# Patient Record
Sex: Female | Born: 1955 | Race: White | Hispanic: No | Marital: Married | Smoking: Never smoker
Health system: Southern US, Community
[De-identification: ages and names within clinical notes are randomized; demographics above are authoritative.]

## PROBLEM LIST (undated history)

## (undated) DIAGNOSIS — R5383 Other fatigue: Secondary | ICD-10-CM

## (undated) DIAGNOSIS — R9431 Abnormal electrocardiogram [ECG] [EKG]: Secondary | ICD-10-CM

## (undated) DIAGNOSIS — J9809 Other diseases of bronchus, not elsewhere classified: Secondary | ICD-10-CM

## (undated) DIAGNOSIS — R7303 Prediabetes: Secondary | ICD-10-CM

## (undated) DIAGNOSIS — F419 Anxiety disorder, unspecified: Secondary | ICD-10-CM

## (undated) DIAGNOSIS — N289 Disorder of kidney and ureter, unspecified: Secondary | ICD-10-CM

## (undated) DIAGNOSIS — I10 Essential (primary) hypertension: Secondary | ICD-10-CM

## (undated) DIAGNOSIS — R6884 Jaw pain: Secondary | ICD-10-CM

## (undated) DIAGNOSIS — R6889 Other general symptoms and signs: Secondary | ICD-10-CM

## (undated) DIAGNOSIS — N200 Calculus of kidney: Secondary | ICD-10-CM

## (undated) HISTORY — DX: Other fatigue: R53.83

## (undated) HISTORY — DX: Other diseases of bronchus, not elsewhere classified: J98.09

## (undated) HISTORY — PX: BRAIN SURGERY: SHX531

## (undated) HISTORY — DX: Other general symptoms and signs: R68.89

## (undated) HISTORY — PX: APPENDECTOMY: SHX54

## (undated) HISTORY — DX: Essential (primary) hypertension: I10

## (undated) HISTORY — DX: Abnormal electrocardiogram (ECG) (EKG): R94.31

## (undated) HISTORY — DX: Calculus of kidney: N20.0

## (undated) HISTORY — PX: OTHER SURGICAL HISTORY: SHX169

## (undated) HISTORY — DX: Prediabetes: R73.03

## (undated) HISTORY — DX: Jaw pain: R68.84

## (undated) HISTORY — DX: Anxiety disorder, unspecified: F41.9

---

## 2001-06-14 ENCOUNTER — Other Ambulatory Visit: Admission: RE | Admit: 2001-06-14 | Discharge: 2001-06-14 | Payer: Self-pay | Admitting: Obstetrics and Gynecology

## 2002-07-16 ENCOUNTER — Ambulatory Visit (HOSPITAL_COMMUNITY): Admission: RE | Admit: 2002-07-16 | Discharge: 2002-07-16 | Payer: Self-pay | Admitting: Obstetrics and Gynecology

## 2002-07-16 ENCOUNTER — Encounter: Payer: Self-pay | Admitting: Obstetrics and Gynecology

## 2003-07-02 ENCOUNTER — Other Ambulatory Visit: Admission: RE | Admit: 2003-07-02 | Discharge: 2003-07-02 | Payer: Self-pay | Admitting: Obstetrics and Gynecology

## 2003-07-16 ENCOUNTER — Encounter: Payer: Self-pay | Admitting: Obstetrics and Gynecology

## 2003-07-16 ENCOUNTER — Ambulatory Visit (HOSPITAL_COMMUNITY): Admission: RE | Admit: 2003-07-16 | Discharge: 2003-07-16 | Payer: Self-pay | Admitting: Obstetrics and Gynecology

## 2004-06-22 ENCOUNTER — Ambulatory Visit (HOSPITAL_COMMUNITY): Admission: RE | Admit: 2004-06-22 | Discharge: 2004-06-22 | Payer: Self-pay | Admitting: Obstetrics and Gynecology

## 2009-01-29 ENCOUNTER — Ambulatory Visit (HOSPITAL_COMMUNITY): Admission: RE | Admit: 2009-01-29 | Discharge: 2009-01-29 | Payer: Self-pay | Admitting: Pediatrics

## 2009-04-04 ENCOUNTER — Encounter: Payer: Self-pay | Admitting: Cardiology

## 2011-03-31 ENCOUNTER — Encounter: Payer: Self-pay | Admitting: Cardiology

## 2011-04-13 DIAGNOSIS — R6884 Jaw pain: Secondary | ICD-10-CM | POA: Insufficient documentation

## 2011-04-13 DIAGNOSIS — R9431 Abnormal electrocardiogram [ECG] [EKG]: Secondary | ICD-10-CM | POA: Insufficient documentation

## 2011-04-13 DIAGNOSIS — R5383 Other fatigue: Secondary | ICD-10-CM | POA: Insufficient documentation

## 2011-04-13 DIAGNOSIS — G43909 Migraine, unspecified, not intractable, without status migrainosus: Secondary | ICD-10-CM | POA: Insufficient documentation

## 2011-04-13 DIAGNOSIS — F419 Anxiety disorder, unspecified: Secondary | ICD-10-CM | POA: Insufficient documentation

## 2011-04-13 DIAGNOSIS — R6889 Other general symptoms and signs: Secondary | ICD-10-CM | POA: Insufficient documentation

## 2011-04-14 ENCOUNTER — Encounter: Payer: Self-pay | Admitting: Cardiology

## 2011-04-14 ENCOUNTER — Ambulatory Visit (INDEPENDENT_AMBULATORY_CARE_PROVIDER_SITE_OTHER): Payer: BC Managed Care – PPO | Admitting: Cardiology

## 2011-04-14 VITALS — BP 126/76 | HR 85 | Ht 61.0 in | Wt 198.0 lb

## 2011-04-14 DIAGNOSIS — R9431 Abnormal electrocardiogram [ECG] [EKG]: Secondary | ICD-10-CM

## 2011-04-14 NOTE — Patient Instructions (Signed)
Your physician recommends that you weigh, daily, at the same time every day, and in the same amount of clothing. Please record your daily weights on the handout provided and bring it to your next appointment.  Your physician recommends that you start walking at least 3 hours per week  Your physician recommends that you schedule a follow-up appointment in: as needed

## 2011-04-14 NOTE — Progress Notes (Signed)
HPI [name] is a delightful 55 year old married white female who comes today referred by Dr. Milford Cage for the evaluation and management of an abnormal EKG.  This was found on a routine physical. Outside tracing shows normal sinus rhythm with a left axis shift with a left anterior fascicular block. She has poor progression in the anterior precordium but no ST segment changes. She is totally asymptomatic except for some mild dyspnea on exertion which is chronic. She attributes this 2 weight.  She's had no previous cardiac history. She denies any orthopnea, PND or edema. She's had no palpitations or syncope nor any chest pain.  Blood work from 2010 showed her blood sugar was elevated at 115. Lipid panel at that time was also abnormal with an HDL 45, total cholesterol 782, triglycerides of 272 VLDL of 54 and LDL 119. Thyroid function that time was normal.  She does not exercise. She's a fabulous cup. She enjoys natural fruit and some sweets and also complex carbs.  Her echocardiogram was repeated here shows normal sinus rhythm with a left anterior fascicular block and poor R-Wave progression.Marland Kitchen No changes From outside EKG.    Past Medical History  Diagnosis Date  . Migraine   . Anxiety   . Fatigue   . Jaw pain   . Abnormal EKG   . Heat intolerance     No past surgical history on file.  No family history on file.  History   Social History  . Marital Status: Married    Spouse Name: N/A    Number of Children: N/A  . Years of Education: N/A   Occupational History  . Not on file.   Social History Main Topics  . Smoking status: Never Smoker   . Smokeless tobacco: Never Used  . Alcohol Use: No  . Drug Use: No  . Sexually Active: Not on file   Other Topics Concern  . Not on file   Social History Narrative  . No narrative on file    Allergies  Allergen Reactions  . Phenobarbital     Current Outpatient Prescriptions  Medication Sig Dispense Refill  . ALPRAZolam (XANAX)  0.5 MG tablet Take 0.5 mg by mouth at bedtime as needed.        Marland Kitchen aspirin 81 MG tablet Take 81 mg by mouth daily.        Marland Kitchen FLUoxetine (PROZAC) 20 MG capsule Take 20 mg by mouth 3 (three) times daily.        Marland Kitchen losartan-hydrochlorothiazide (HYZAAR) 100-25 MG per tablet Take 1 tablet by mouth daily.        Marland Kitchen DISCONTD: FLUoxetine HCl (PROZAC PO) Take 1 tablet by mouth daily.       Marland Kitchen DISCONTD: oxyCODONE-acetaminophen (PERCOCET) 5-325 MG per tablet Take 1 tablet by mouth every 4 (four) hours as needed.          ROS Negative other than HPI.   PE General Appearance: well developed, well nourished in no acute distress, obese HEENT: symmetrical face, PERRLA, good dentition  Neck: no JVD, thyromegaly, or adenopathy, trachea midline Chest: symmetric without deformity Cardiac: PMI non-displaced, RRR, normal S1, S2, no gallop or murmur Lung: clear to ausculation and percussion Vascular: all pulses full without bruits  Abdominal: nondistended, nontender, good bowel sounds, no HSM, no bruits Extremities: no cyanosis, clubbing or edema, no sign of DVT, no varicosities  Skin: normal color, no rashes Neuro: alert and oriented x 3, non-focal Pysch: normal affect  Filed Vitals:   04/14/11  1031  BP: 126/76  Pulse: 85  Height: 5\' 1"  (1.549 m)  Weight: 198 lb (89.812 kg)  SpO2: 97%    EKG  Labs and Studies Reviewed.   No results found for this basename: WBC, HGB, HCT, MCV, PLT      Chemistry   No results found for this basename: NA, K, CL, CO2, BUN, CREATININE, GLU   No results found for this basename: CALCIUM, ALKPHOS, AST, ALT, BILITOT       No results found for this basename: CHOL   No results found for this basename: HDL   No results found for this basename: LDLCALC   No results found for this basename: TRIG   No results found for this basename: CHOLHDL   No results found for this basename: HGBA1C   No results found for this basename: ALT, AST, GGT, ALKPHOS, BILITOT   No  results found for this basename: TSH

## 2011-04-14 NOTE — Assessment & Plan Note (Signed)
Her EKG is normal for her except for a degree of left axis deviation. My primary concern for her is her risk factors for coronary disease and other vascular disease including her weight, likelihood she may be prediabetic or diabetic, and her mixed hyperlipidemia. I've advised Kela Baccari 3 hours a week, loose at least 20-25 pounds, and have her fasting blood work checked per Dr. Milford Cage. I will leave it to his expertise for treating any metabolic abnormalities. If she is diabetic her LDL needs to be below 100. She will most likely need a statin. If she is diabetic as well she will need a low dose aspirin 81 mg a day. We will see her back on a p.r.n. Basis.

## 2013-04-16 ENCOUNTER — Emergency Department (HOSPITAL_COMMUNITY): Payer: BC Managed Care – PPO

## 2013-04-16 ENCOUNTER — Emergency Department (HOSPITAL_COMMUNITY)
Admission: EM | Admit: 2013-04-16 | Discharge: 2013-04-16 | Disposition: A | Discharge status: Home / Self Care | Payer: BC Managed Care – PPO | Attending: Emergency Medicine | Admitting: Emergency Medicine

## 2013-04-16 ENCOUNTER — Encounter (HOSPITAL_COMMUNITY): Payer: Self-pay

## 2013-04-16 DIAGNOSIS — M5030 Other cervical disc degeneration, unspecified cervical region: Secondary | ICD-10-CM

## 2013-04-16 DIAGNOSIS — Z9889 Other specified postprocedural states: Secondary | ICD-10-CM | POA: Insufficient documentation

## 2013-04-16 DIAGNOSIS — Z87448 Personal history of other diseases of urinary system: Secondary | ICD-10-CM | POA: Insufficient documentation

## 2013-04-16 DIAGNOSIS — F411 Generalized anxiety disorder: Secondary | ICD-10-CM | POA: Insufficient documentation

## 2013-04-16 DIAGNOSIS — Z8679 Personal history of other diseases of the circulatory system: Secondary | ICD-10-CM | POA: Insufficient documentation

## 2013-04-16 DIAGNOSIS — Z79899 Other long term (current) drug therapy: Secondary | ICD-10-CM | POA: Insufficient documentation

## 2013-04-16 DIAGNOSIS — R209 Unspecified disturbances of skin sensation: Secondary | ICD-10-CM | POA: Insufficient documentation

## 2013-04-16 DIAGNOSIS — Z7982 Long term (current) use of aspirin: Secondary | ICD-10-CM | POA: Insufficient documentation

## 2013-04-16 DIAGNOSIS — R51 Headache: Secondary | ICD-10-CM | POA: Insufficient documentation

## 2013-04-16 DIAGNOSIS — Z8719 Personal history of other diseases of the digestive system: Secondary | ICD-10-CM | POA: Insufficient documentation

## 2013-04-16 DIAGNOSIS — R450 Nervousness: Secondary | ICD-10-CM | POA: Insufficient documentation

## 2013-04-16 DIAGNOSIS — M4802 Spinal stenosis, cervical region: Secondary | ICD-10-CM

## 2013-04-16 HISTORY — DX: Disorder of kidney and ureter, unspecified: N28.9

## 2013-04-16 MED ORDER — DIAZEPAM 5 MG PO TABS
5.0000 mg | ORAL_TABLET | Freq: Once | ORAL | Status: AC
  Administered 2013-04-16: 5 mg via ORAL
  Filled 2013-04-16: qty 1

## 2013-04-16 MED ORDER — HYDROMORPHONE HCL PF 1 MG/ML IJ SOLN
1.0000 mg | Freq: Once | INTRAMUSCULAR | Status: AC
  Administered 2013-04-16: 1 mg via INTRAVENOUS
  Filled 2013-04-16: qty 1

## 2013-04-16 MED ORDER — DEXAMETHASONE 6 MG PO TABS: ORAL_TABLET | ORAL | Status: DC

## 2013-04-16 MED ORDER — METHOCARBAMOL 500 MG PO TABS: 500.0000 mg | ORAL_TABLET | Freq: Three times a day (TID) | ORAL | Status: DC

## 2013-04-16 MED ORDER — DEXAMETHASONE SODIUM PHOSPHATE 4 MG/ML IJ SOLN
10.0000 mg | Freq: Once | INTRAMUSCULAR | Status: AC
  Administered 2013-04-16: 10 mg via INTRAVENOUS
  Filled 2013-04-16: qty 3

## 2013-04-16 MED ORDER — ONDANSETRON HCL 4 MG PO TABS
4.0000 mg | ORAL_TABLET | Freq: Once | ORAL | Status: AC
  Administered 2013-04-16: 4 mg via ORAL
  Filled 2013-04-16: qty 1

## 2013-04-16 MED ORDER — OXYCODONE-ACETAMINOPHEN 5-325 MG PO TABS: 1.0000 | ORAL_TABLET | Freq: Four times a day (QID) | ORAL | Status: DC | PRN

## 2013-04-16 NOTE — ED Provider Notes (Signed)
History     CSN: [id number]  Arrival date & time 04/16/13  0745   First MD Initiated Contact with Patient 04/16/13 [phone]      Chief Complaint  Patient presents with  . Neck Pain    (Consider location/radiation/quality/duration/timing/severity/associated sxs/prior treatment) Patient is a 57 y.o. female presenting with neck pain. The history is provided by the patient.  Neck Pain Pain location:  L side Quality:  Aching, burning and cramping Pain radiates to:  L scapula and L arm Pain severity:  Severe Pain is:  Same all the time Onset quality:  Gradual Duration:  1 week Timing:  Constant Progression:  Worsening Chronicity:  New Context: not fall, not lifting a heavy object, not MCA and not recent injury   Relieved by:  Nothing Ineffective treatments:  NSAIDs (TENs Unit) Associated symptoms: headaches and tingling   Associated symptoms: no bladder incontinence, no bowel incontinence, no chest pain and no numbness   Risk factors: no recent head injury and no recurrent falls     Past Medical History  Diagnosis Date  . Migraine   . Anxiety   . Fatigue   . Jaw pain   . Abnormal EKG   . Heat intolerance   . Renal disorder     Past Surgical History  Procedure Laterality Date  . Cesarean section    . Brain surgery    . Laproscopy    . Kidnsey stones       No family history on file.  History  Substance Use Topics  . Smoking status: Never Smoker   . Smokeless tobacco: Never Used  . Alcohol Use: No    OB History   Grav Para Term Preterm Abortions TAB SAB Ect Mult Living                  Review of Systems  HENT: Positive for neck pain.   Cardiovascular: Negative for chest pain.  Gastrointestinal: Negative for bowel incontinence.  Genitourinary: Negative for bladder incontinence.  Neurological: Positive for tingling and headaches. Negative for numbness.  Psychiatric/Behavioral: The patient is nervous/anxious.     Allergies  Phenobarbital  Home  Medications   Current Outpatient Rx  Name  Route  Sig  Dispense  Refill  . ALPRAZolam (XANAX) 0.5 MG tablet   Oral   Take 0.5 mg by mouth at bedtime as needed for sleep.          Marland Kitchen aspirin 81 MG tablet   Oral   Take 81 mg by mouth daily.           Marland Kitchen FLUoxetine (PROZAC) 20 MG capsule   Oral   Take 20 mg by mouth daily.          Marland Kitchen losartan-hydrochlorothiazide (HYZAAR) 100-25 MG per tablet   Oral   Take 1 tablet by mouth daily.           . Multiple Vitamin (MULTIVITAMIN WITH MINERALS) TABS   Oral   Take 1 tablet by mouth daily.           BP 154/96  Pulse 99  Temp(Src) 98.6 F (37 C) (Oral)  Resp 22  Ht 5\' 2"  (1.575 m)  Wt 198 lb (89.812 kg)  BMI 36.21 kg/m2  SpO2 99%  Physical Exam  Nursing note and vitals reviewed. Constitutional: She is oriented to person, place, and time. She appears well-developed and well-nourished.  Non-toxic appearance.  HENT:  Head: Normocephalic.  Right Ear: Tympanic membrane and external ear normal.  Left Ear: Tympanic membrane and external ear normal.  Eyes: EOM and lids are normal. Pupils are equal, round, and reactive to light.  Neck: Neck supple. Carotid bruit is not present.  Pain with attempted ROM of the neck. Left more than right. Pain extends to the C5-C6 area. Some pain to palpation. No hot areas  Cardiovascular: Normal rate, regular rhythm, normal heart sounds, intact distal pulses and normal pulses.   Pulmonary/Chest: Breath sounds normal. No respiratory distress.  Abdominal: Soft. Bowel sounds are normal. There is no tenderness. There is no guarding.  Musculoskeletal:       Left shoulder: She exhibits decreased range of motion.       Arms: Lymphadenopathy:       Head (right side): No submandibular adenopathy present.       Head (left side): No submandibular adenopathy present.    She has no cervical adenopathy.  Neurological: She is alert and oriented to person, place, and time. She displays no atrophy and no  tremor. No cranial nerve deficit or sensory deficit.  Pt hesitant to cooperate with the strength exam of the left upper ext due to pain.  Skin: Skin is warm and dry.  Psychiatric: She has a normal mood and affect. Her speech is normal.    ED Course  Procedures (including critical care time)  Labs Reviewed - No data to display Ct Cervical Spine Wo Contrast  04/16/2013   *RADIOLOGY REPORT*  Clinical Data: Left-sided neck and shoulder pain for 1 week.  No trauma.  Recent chiropractic visit.  CT CERVICAL SPINE WITHOUT CONTRAST  Technique:  Multidetector CT imaging of the cervical spine was performed. Multiplanar CT image reconstructions were also generated.  Comparison: None.  Findings: Spinal visualization through the bottom of C7.  Mild obscuration due to overlying soft tissues and C5 inferiorly. Prevertebral soft tissues are within normal limits.  Relatively mild spondylosis at C4-C5 with disc osteophyte complex and mild left-sided neural foraminal narrowing secondary uncovertebral joint hypertrophy.  Moderate spondylosis at C5-C6, with prominent disc osteophyte complex which causes probable central canal stenosis.  Aerated petrous apices. Skull base intact.  Maintenance of vertebral body heights.  Mild straightening and reversal expected lordosis centered about C5-C6. Facets are well-aligned.  Coronal reformats demonstrate a normal C1-C2 articulation.  .  IMPRESSION:  1. No acute osseous abnormality. 2.  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 This results in probable central canal and left-sided neural foraminal narrowing. Suboptimally evaluated with CT.  MRI would be of increased accuracy. 3.  Mild nonspecific straightening and reversal of expected lordosis centered about the C5-C6 level.   Original Report Authenticated By: Jeronimo Greaves, M.D.    Date: 04/16/2013  Rat80  Rhythm: normal sinus rhythm  QRS Axis: normal  Intervals: normal  ST/T Wave abnormalities: None  Conduction Disutrbances:none   Narrative Interpretation: No STEMI  Old EKG Reviewed: none available   No diagnosis found.    MDM  *I have reviewed nursing notes, vital signs, and all appropriate lab and imaging results for this patient.** Pain partially improved with IV dilaudid. A second dose given. CT of the c spine reveals C4-C5 spondylosis and left sided foraminal narrowing. MRI suggested. Due to increasing pain, some change in strength of the upper extremity and suggestion of MRI screening by radiology, MRI was obtained. Pt has left lateral recess and paracentral disc protrusion of C6-c7.  There is moderate central stenosis with moderate left foraminal stenosis. There is a small amount of blood product suspicious for subacute  disc protrusion.  Pt referred to neurosurgery. Rx for decadron, robaxin and percocet given to the patient. Pt to return to ED immediately if any changes or problem.       Kathie Dike, PA-C 04/23/13 2137

## 2013-04-16 NOTE — Progress Notes (Signed)
One gold necklace with small diamond pendant, 2 gold hoop earrings placed in ziploc bag and given to patient.

## 2013-04-16 NOTE — ED Notes (Signed)
Pt reports left side of neck and left arm pain for 1 week, has been to chiropractor and pain is worse, has tried otc meds .    Tearful, unable to put arm down.

## 2013-04-26 NOTE — ED Provider Notes (Signed)
Medical screening examination/treatment/procedure(s) were performed by non-physician practitioner and as supervising physician I was immediately available for consultation/collaboration.  Hurman Horn, MD 04/26/13 1248

## 2013-07-09 ENCOUNTER — Encounter: Payer: Self-pay | Admitting: Family Medicine

## 2013-07-11 ENCOUNTER — Ambulatory Visit (INDEPENDENT_AMBULATORY_CARE_PROVIDER_SITE_OTHER): Payer: BC Managed Care – PPO | Admitting: Family Medicine

## 2013-07-11 ENCOUNTER — Encounter: Payer: Self-pay | Admitting: Family Medicine

## 2013-07-11 VITALS — BP 160/78 | Ht 62.0 in | Wt 208.2 lb

## 2013-07-11 DIAGNOSIS — I10 Essential (primary) hypertension: Secondary | ICD-10-CM

## 2013-07-11 DIAGNOSIS — F411 Generalized anxiety disorder: Secondary | ICD-10-CM

## 2013-07-11 MED ORDER — LOSARTAN POTASSIUM-HCTZ 100-25 MG PO TABS: 1.0000 | ORAL_TABLET | Freq: Every day | ORAL | Status: AC

## 2013-07-11 MED ORDER — FLUOXETINE HCL 20 MG PO CAPS: 20.0000 mg | ORAL_CAPSULE | Freq: Every day | ORAL | Status: DC

## 2013-07-11 MED ORDER — ALPRAZOLAM 0.25 MG PO TABS: 0.2500 mg | ORAL_TABLET | Freq: Every evening | ORAL | Status: DC | PRN

## 2013-07-11 NOTE — Patient Instructions (Addendum)

## 2013-07-11 NOTE — Progress Notes (Signed)
  Subjective:    Patient ID: Sabrina Mcdonald, female    DOB: Jun 12, 1956, 57 y.o.   MRN: [id number]  HPI Pt here to re-establish care. She has not seen a pcp in over a year since previous pcp left. She ran out of her BP meds a week ago. In the past year she has been well except for an incident over the summer wherein she required surgery on her spine for cervical stenosis. She has 3 chronic problems to discuss today:  Anxiety - previous prescription was for xanax 0.5mg  and she always broken them in half. Says she took roughly 8-10 per month before bed as needed for anxiety. Does well on this dose.   Depression - has been on prozac 20mg /day for 2-3 years and finds it very helpful in regulating her mood. No adverse effects that she has noticed.   HTN - has been on hyzaar for over a year but not for the past week. She has not had any adverse effects. She says her BP even on it was "borderline" which to her meant upper 130's/80. She has not had any cp, headaches, or blurred vision this week.   She has not had a cpe or bloodwork done in over a year.    Review of Systemsno chest pain, no GI sx     Objective:   Physical Exam  Nursing note and vitals reviewed. Constitutional: She appears well-developed and well-nourished.  HENT:  Right Ear: External ear normal.  Left Ear: External ear normal.  Mouth/Throat: Oropharynx is clear and moist.  Neck: Normal range of motion. Neck supple. No thyromegaly present.  Cardiovascular: Normal rate, regular rhythm and normal heart sounds.   Pulmonary/Chest: Effort normal and breath sounds normal.  Abdominal: Soft. There is no tenderness.  Lymphadenopathy:    She has no cervical adenopathy.  Skin: Skin is warm and dry.  Psychiatric: She has a normal mood and affect.          Assessment & Plan:  Anxiety - decreased xanax to 0.25mg  and gave 30 tabs. Told her it needs to last a minimum of 4 months. If not, will need to evaluate if other approach to her  anxiety would be more appropriate. I'd like to space out her xanax taking even more over time.   Depression - we discussed potential side effects, and she will let me know if any arise. For now since she is doing well we both agree to keep her on the same dose.   HTN - Refilled her hyzaar and asked her to restart it with 1/2 tab the first 2 days and then proceed to the full tablet daily. After a week or so she will check her BP and record it 3-4 times. She will bring this record next time I see her and we can adjust meds if indicated. She is due for HTN labs and we'll do those along with her CPE labs at her next visit. I have asked her to come fasting so we can get a full lipid panel.   CPE - she will rtc in 4-6 weeks for her cpe. At that time we can check in on her htn as well as noted above. If any concerns arise meanwhile, she will let us know.

## 2013-08-15 ENCOUNTER — Encounter: Payer: BC Managed Care – PPO | Admitting: Family Medicine

## 2013-10-02 ENCOUNTER — Encounter: Payer: Self-pay | Admitting: Family Medicine

## 2013-10-02 ENCOUNTER — Ambulatory Visit (INDEPENDENT_AMBULATORY_CARE_PROVIDER_SITE_OTHER): Payer: BC Managed Care – PPO | Admitting: Family Medicine

## 2013-10-02 ENCOUNTER — Ambulatory Visit (HOSPITAL_COMMUNITY)
Admission: RE | Admit: 2013-10-02 | Discharge: 2013-10-02 | Disposition: A | Discharge status: Home / Self Care | Payer: BC Managed Care – PPO | Source: Ambulatory Visit | Attending: Family Medicine | Admitting: Family Medicine

## 2013-10-02 ENCOUNTER — Telehealth: Payer: Self-pay | Admitting: *Deleted

## 2013-10-02 VITALS — BP 140/78 | HR 114 | Temp 98.0°F | Resp 24 | Ht 60.5 in | Wt 200.0 lb

## 2013-10-02 DIAGNOSIS — R059 Cough, unspecified: Secondary | ICD-10-CM | POA: Insufficient documentation

## 2013-10-02 DIAGNOSIS — R05 Cough: Secondary | ICD-10-CM | POA: Insufficient documentation

## 2013-10-02 LAB — POC INFLUENZA A&B (BINAX/QUICKVUE)
Influenza A, POC: NEGATIVE
Influenza B, POC: NEGATIVE

## 2013-10-02 MED ORDER — HYDROCOD POLST-CHLORPHEN POLST 10-8 MG/5ML PO LQCR: 5.0000 mL | Freq: Two times a day (BID) | ORAL | Status: DC | PRN

## 2013-10-02 MED ORDER — OSELTAMIVIR PHOSPHATE 75 MG PO CAPS: 75.0000 mg | ORAL_CAPSULE | Freq: Two times a day (BID) | ORAL | Status: DC

## 2013-10-02 NOTE — Patient Instructions (Signed)

## 2013-10-02 NOTE — Progress Notes (Signed)
   Subjective:    Patient ID: Sabrina Mcdonald, female    DOB: 10/18/1956, 57 y.o.   MRN: [id number]  HPI Pt here with sudden onset of shaking chills, subjective fever (but feeling too ill to go looking for her thermometer), cough, and muscle aches significant enough to be taking motrin around the clock. She had a ST the first day but this has resolved. She is a nonsmoker and did not have a flu shot this year. Mild nausea but no vomiting. She isnt very hungry but has been pushing fluids.     Review of Systems per hpi     Objective:   Physical Exam  Nursing note and vitals reviewed. Constitutional: She is oriented to person, place, and time. She appears well-developed and well-nourished.  HENT:  Right Ear: External ear normal.  Left Ear: External ear normal.  Nose: Nose normal.  Mouth/Throat: Oropharynx is clear and moist. No oropharyngeal exudate.  Eyes: Conjunctivae are normal. Pupils are equal, round, and reactive to light.  Neck: Normal range of motion. Neck supple. No thyromegaly present.  Cardiovascular: Normal rate, regular rhythm and normal heart sounds.   Pulmonary/Chest: Effort normal and breath sounds normal although air movement sounds a bit diminished Abdominal: Soft. Bowel sounds are normal. She exhibits no distension. There is no tenderness. There is no rebound.  Lymphadenopathy:    She has no cervical adenopathy.  Neurological: She is alert and oriented to person, place, and time. She has normal reflexes.  Skin: Skin is warm and dry. she feels warm to me. Psychiatric: She has a normal mood and affect. Her behavior is normal.        Assessment & Plan:  Cough - Plan: POC Influenza A&B, oseltamivir (TAMIFLU) 75 MG capsule, DG Chest 2 View, chlorpheniramine-HYDROcodone (TUSSIONEX) 10-8 MG/5ML LQCR - sympomatically she has the flu and as she is still within 48 hours will treat - cxr to r/o pna - discussed red flags for urgent evaluation rtc prn fr this illness, rtc for  cpe when feeling better

## 2013-10-02 NOTE — Progress Notes (Signed)
See telephone encounter.

## 2013-10-02 NOTE — Telephone Encounter (Signed)
Message copied by [facility], [facility] on Tue Oct 02, 2013  3:37 PM ------      Message from: Acey Lav      Created: Tue Oct 02, 2013  3:20 PM       Please let pt know cxr shows no pna. Thanks AW ------

## 2013-10-02 NOTE — Telephone Encounter (Signed)
Pt notified and appreciative.

## 2013-10-06 ENCOUNTER — Telehealth: Payer: Self-pay | Admitting: Family Medicine

## 2013-10-06 NOTE — Telephone Encounter (Signed)
Paged by nursing staff on call. Patient complains of blisters across her top lip, crossing midline. She told the nurse on call that she had shingles before and wanted medicine called in. I contacted the patient personally because shingles infection twice is rare, but not impossible.  I spoke to Sabrina Mcdonald and she mentions the visit on 12/9. She had cxr which was negative and on last day of Tamiflu. Her flu swab was negative but she says she is coughing phlegm up that's yellow and brown. She says she isn't 100% but better. She was concerned and asked the pharmacy what she can get and that told her Abreva.  I spoke to her regarding the symptoms and she had shngles 3 years ago. I told her it's rare to get 2nd infection but possible. Although this can happen, her blisters on just her top lip and crossing midline, goes against Shingles, as this isn't a dermatome. With the facial nerve root infected, it would be more than just her top lip outline infected. She voiced understanding and went on and got the abreva the pharmacy told her to get. 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  She denies fevers, chest pains, sob, or muscle aches.  I advised her to go to the ER if her symptoms worsen or call me back via nurse line if she has any more concerns. She was appreciative and understanding.

## 2013-10-10 ENCOUNTER — Telehealth: Payer: Self-pay | Admitting: *Deleted

## 2013-10-10 NOTE — Telephone Encounter (Signed)
Depends on how sick she is. Flulike sx can last for over a week. The cough can drag on for 2 mos. If she is still having fevers, having a hard time staying hydrated due to vomiting or loose stools, or feels worse, then she should be re-evaluated. If she is slowly improving, probably no need to see her again. If she is in doubt, probably best to err on the safe side an be seen. I can't tell much from a phone call. Thanks AW.

## 2013-10-10 NOTE — Telephone Encounter (Signed)
Pt called and left VM stating that she was seen by MD and dx with flu. She stated that she took her medication but is still sick. She stated that she still has fever at night and wanted to know if she should be seen again or what MD suggested. Will route to MD.

## 2013-10-10 NOTE — Telephone Encounter (Signed)
Pt states that she is feeling a little better that she is working now but she just needed to be sure. Encouraged her that if she felt as if she was getting worse to make an appointment to be seen. Pt understanding and appreciative.

## 2013-10-11 NOTE — Telephone Encounter (Signed)
Sounds perfect. Thanks AW

## 2018-09-05 DIAGNOSIS — Z85820 Personal history of malignant melanoma of skin: Secondary | ICD-10-CM | POA: Diagnosis not present

## 2018-09-05 DIAGNOSIS — D1801 Hemangioma of skin and subcutaneous tissue: Secondary | ICD-10-CM | POA: Diagnosis not present

## 2018-09-05 DIAGNOSIS — L570 Actinic keratosis: Secondary | ICD-10-CM | POA: Diagnosis not present

## 2018-11-07 DIAGNOSIS — M4722 Other spondylosis with radiculopathy, cervical region: Secondary | ICD-10-CM | POA: Diagnosis not present

## 2018-11-07 DIAGNOSIS — G9520 Unspecified cord compression: Secondary | ICD-10-CM | POA: Diagnosis not present

## 2018-11-07 DIAGNOSIS — Z6836 Body mass index (BMI) 36.0-36.9, adult: Secondary | ICD-10-CM | POA: Diagnosis not present

## 2018-11-30 DIAGNOSIS — S39012D Strain of muscle, fascia and tendon of lower back, subsequent encounter: Secondary | ICD-10-CM | POA: Diagnosis not present

## 2018-11-30 DIAGNOSIS — M47816 Spondylosis without myelopathy or radiculopathy, lumbar region: Secondary | ICD-10-CM | POA: Diagnosis not present

## 2018-11-30 DIAGNOSIS — Z6836 Body mass index (BMI) 36.0-36.9, adult: Secondary | ICD-10-CM | POA: Diagnosis not present

## 2018-12-01 DIAGNOSIS — M545 Low back pain: Secondary | ICD-10-CM | POA: Diagnosis not present

## 2018-12-01 DIAGNOSIS — M5135 Other intervertebral disc degeneration, thoracolumbar region: Secondary | ICD-10-CM | POA: Diagnosis not present

## 2018-12-01 DIAGNOSIS — M5136 Other intervertebral disc degeneration, lumbar region: Secondary | ICD-10-CM | POA: Diagnosis not present

## 2018-12-01 DIAGNOSIS — M47816 Spondylosis without myelopathy or radiculopathy, lumbar region: Secondary | ICD-10-CM | POA: Diagnosis not present

## 2018-12-05 DIAGNOSIS — R05 Cough: Secondary | ICD-10-CM | POA: Diagnosis not present

## 2018-12-05 DIAGNOSIS — Z1231 Encounter for screening mammogram for malignant neoplasm of breast: Secondary | ICD-10-CM | POA: Diagnosis not present

## 2018-12-05 DIAGNOSIS — Z01411 Encounter for gynecological examination (general) (routine) with abnormal findings: Secondary | ICD-10-CM | POA: Diagnosis not present

## 2018-12-05 DIAGNOSIS — R062 Wheezing: Secondary | ICD-10-CM | POA: Diagnosis not present

## 2018-12-05 DIAGNOSIS — Z6835 Body mass index (BMI) 35.0-35.9, adult: Secondary | ICD-10-CM | POA: Diagnosis not present

## 2018-12-06 ENCOUNTER — Encounter (INDEPENDENT_AMBULATORY_CARE_PROVIDER_SITE_OTHER): Payer: Self-pay | Admitting: *Deleted

## 2018-12-12 DIAGNOSIS — M47816 Spondylosis without myelopathy or radiculopathy, lumbar region: Secondary | ICD-10-CM | POA: Diagnosis not present

## 2018-12-12 DIAGNOSIS — M545 Low back pain: Secondary | ICD-10-CM | POA: Diagnosis not present

## 2018-12-14 DIAGNOSIS — Z6837 Body mass index (BMI) 37.0-37.9, adult: Secondary | ICD-10-CM | POA: Diagnosis not present

## 2018-12-14 DIAGNOSIS — M47816 Spondylosis without myelopathy or radiculopathy, lumbar region: Secondary | ICD-10-CM | POA: Diagnosis not present

## 2018-12-14 DIAGNOSIS — S39012D Strain of muscle, fascia and tendon of lower back, subsequent encounter: Secondary | ICD-10-CM | POA: Diagnosis not present

## 2018-12-21 DIAGNOSIS — X58XXXD Exposure to other specified factors, subsequent encounter: Secondary | ICD-10-CM | POA: Diagnosis not present

## 2018-12-21 DIAGNOSIS — M47816 Spondylosis without myelopathy or radiculopathy, lumbar region: Secondary | ICD-10-CM | POA: Diagnosis not present

## 2018-12-21 DIAGNOSIS — S39012D Strain of muscle, fascia and tendon of lower back, subsequent encounter: Secondary | ICD-10-CM | POA: Diagnosis not present

## 2018-12-26 DIAGNOSIS — S39012D Strain of muscle, fascia and tendon of lower back, subsequent encounter: Secondary | ICD-10-CM | POA: Diagnosis not present

## 2018-12-26 DIAGNOSIS — M47816 Spondylosis without myelopathy or radiculopathy, lumbar region: Secondary | ICD-10-CM | POA: Diagnosis not present

## 2018-12-26 DIAGNOSIS — X58XXXD Exposure to other specified factors, subsequent encounter: Secondary | ICD-10-CM | POA: Diagnosis not present

## 2019-01-04 DIAGNOSIS — M47816 Spondylosis without myelopathy or radiculopathy, lumbar region: Secondary | ICD-10-CM | POA: Diagnosis not present

## 2019-01-04 DIAGNOSIS — S39012D Strain of muscle, fascia and tendon of lower back, subsequent encounter: Secondary | ICD-10-CM | POA: Diagnosis not present

## 2019-01-04 DIAGNOSIS — X58XXXD Exposure to other specified factors, subsequent encounter: Secondary | ICD-10-CM | POA: Diagnosis not present

## 2019-01-09 DIAGNOSIS — S39012D Strain of muscle, fascia and tendon of lower back, subsequent encounter: Secondary | ICD-10-CM | POA: Diagnosis not present

## 2019-01-09 DIAGNOSIS — M47816 Spondylosis without myelopathy or radiculopathy, lumbar region: Secondary | ICD-10-CM | POA: Diagnosis not present

## 2019-01-09 DIAGNOSIS — X58XXXD Exposure to other specified factors, subsequent encounter: Secondary | ICD-10-CM | POA: Diagnosis not present

## 2019-01-16 DIAGNOSIS — Z6836 Body mass index (BMI) 36.0-36.9, adult: Secondary | ICD-10-CM | POA: Diagnosis not present

## 2019-01-16 DIAGNOSIS — M47816 Spondylosis without myelopathy or radiculopathy, lumbar region: Secondary | ICD-10-CM | POA: Diagnosis not present

## 2019-01-19 DIAGNOSIS — M549 Dorsalgia, unspecified: Secondary | ICD-10-CM | POA: Diagnosis not present

## 2019-04-21 DIAGNOSIS — J4550 Severe persistent asthma, uncomplicated: Secondary | ICD-10-CM | POA: Diagnosis not present

## 2019-04-21 DIAGNOSIS — J479 Bronchiectasis, uncomplicated: Secondary | ICD-10-CM | POA: Diagnosis not present

## 2019-05-21 DIAGNOSIS — J4550 Severe persistent asthma, uncomplicated: Secondary | ICD-10-CM | POA: Diagnosis not present

## 2019-05-21 DIAGNOSIS — J479 Bronchiectasis, uncomplicated: Secondary | ICD-10-CM | POA: Diagnosis not present

## 2019-05-24 DIAGNOSIS — M542 Cervicalgia: Secondary | ICD-10-CM | POA: Diagnosis not present

## 2019-06-21 DIAGNOSIS — J479 Bronchiectasis, uncomplicated: Secondary | ICD-10-CM | POA: Diagnosis not present

## 2019-06-21 DIAGNOSIS — J4550 Severe persistent asthma, uncomplicated: Secondary | ICD-10-CM | POA: Diagnosis not present

## 2019-07-22 DIAGNOSIS — J4550 Severe persistent asthma, uncomplicated: Secondary | ICD-10-CM | POA: Diagnosis not present

## 2019-07-22 DIAGNOSIS — J479 Bronchiectasis, uncomplicated: Secondary | ICD-10-CM | POA: Diagnosis not present

## 2019-08-21 DIAGNOSIS — J4550 Severe persistent asthma, uncomplicated: Secondary | ICD-10-CM | POA: Diagnosis not present

## 2019-08-21 DIAGNOSIS — J479 Bronchiectasis, uncomplicated: Secondary | ICD-10-CM | POA: Diagnosis not present

## 2019-09-21 DIAGNOSIS — J4550 Severe persistent asthma, uncomplicated: Secondary | ICD-10-CM | POA: Diagnosis not present

## 2019-09-21 DIAGNOSIS — J479 Bronchiectasis, uncomplicated: Secondary | ICD-10-CM | POA: Diagnosis not present

## 2019-09-25 DIAGNOSIS — Z85820 Personal history of malignant melanoma of skin: Secondary | ICD-10-CM | POA: Diagnosis not present

## 2019-09-25 DIAGNOSIS — L570 Actinic keratosis: Secondary | ICD-10-CM | POA: Diagnosis not present

## 2019-09-25 DIAGNOSIS — L821 Other seborrheic keratosis: Secondary | ICD-10-CM | POA: Diagnosis not present

## 2019-10-21 DIAGNOSIS — J479 Bronchiectasis, uncomplicated: Secondary | ICD-10-CM | POA: Diagnosis not present

## 2019-10-21 DIAGNOSIS — J4550 Severe persistent asthma, uncomplicated: Secondary | ICD-10-CM | POA: Diagnosis not present

## 2019-11-21 DIAGNOSIS — J4550 Severe persistent asthma, uncomplicated: Secondary | ICD-10-CM | POA: Diagnosis not present

## 2019-11-21 DIAGNOSIS — J479 Bronchiectasis, uncomplicated: Secondary | ICD-10-CM | POA: Diagnosis not present

## 2019-12-20 DIAGNOSIS — I10 Essential (primary) hypertension: Secondary | ICD-10-CM | POA: Diagnosis not present

## 2019-12-20 DIAGNOSIS — J449 Chronic obstructive pulmonary disease, unspecified: Secondary | ICD-10-CM | POA: Diagnosis not present

## 2019-12-20 DIAGNOSIS — K3580 Unspecified acute appendicitis: Secondary | ICD-10-CM | POA: Diagnosis not present

## 2019-12-20 DIAGNOSIS — R1031 Right lower quadrant pain: Secondary | ICD-10-CM | POA: Diagnosis not present

## 2019-12-20 DIAGNOSIS — Z7952 Long term (current) use of systemic steroids: Secondary | ICD-10-CM | POA: Diagnosis not present

## 2019-12-20 DIAGNOSIS — Z20822 Contact with and (suspected) exposure to covid-19: Secondary | ICD-10-CM | POA: Diagnosis not present

## 2019-12-20 DIAGNOSIS — Z79899 Other long term (current) drug therapy: Secondary | ICD-10-CM | POA: Diagnosis not present

## 2019-12-21 DIAGNOSIS — R109 Unspecified abdominal pain: Secondary | ICD-10-CM | POA: Diagnosis not present

## 2019-12-21 DIAGNOSIS — J449 Chronic obstructive pulmonary disease, unspecified: Secondary | ICD-10-CM | POA: Diagnosis not present

## 2019-12-21 DIAGNOSIS — K3580 Unspecified acute appendicitis: Secondary | ICD-10-CM | POA: Diagnosis not present

## 2019-12-22 DIAGNOSIS — J4550 Severe persistent asthma, uncomplicated: Secondary | ICD-10-CM | POA: Diagnosis not present

## 2019-12-22 DIAGNOSIS — J479 Bronchiectasis, uncomplicated: Secondary | ICD-10-CM | POA: Diagnosis not present

## 2020-01-01 ENCOUNTER — Encounter: Payer: Self-pay | Admitting: Internal Medicine

## 2020-01-04 DIAGNOSIS — Z9049 Acquired absence of other specified parts of digestive tract: Secondary | ICD-10-CM | POA: Insufficient documentation

## 2020-01-15 ENCOUNTER — Encounter: Payer: Self-pay | Admitting: Nurse Practitioner

## 2020-01-15 ENCOUNTER — Ambulatory Visit: Payer: BC Managed Care – PPO | Admitting: Nurse Practitioner

## 2020-01-15 ENCOUNTER — Encounter: Payer: Self-pay | Admitting: *Deleted

## 2020-01-15 ENCOUNTER — Other Ambulatory Visit: Payer: Self-pay

## 2020-01-15 DIAGNOSIS — Z860100 Personal history of colon polyps, unspecified: Secondary | ICD-10-CM | POA: Insufficient documentation

## 2020-01-15 DIAGNOSIS — K5900 Constipation, unspecified: Secondary | ICD-10-CM

## 2020-01-15 MED ORDER — NA SULFATE-K SULFATE-MG SULF 17.5-3.13-1.6 GM/177ML PO SOLN
1.0000 | Freq: Once | ORAL | 0 refills | Status: AC
Start: 2020-01-15 — End: 2020-01-15

## 2020-01-15 NOTE — Progress Notes (Signed)
Primary Care Physician:  Allayne Butcher, NP Primary Gastroenterologist:  Dr. Gala Romney  Chief Complaint  Patient presents with  . Colonoscopy    consult ref by Dr. Damian Leavell     HPI:   Sabrina Mcdonald is a 64 y.o. female who presents on referral from primary care 01/01/2020 to schedule colonoscopy.  Nurse/phone triage was deferred office visit due to medications likely necessitating MAC sedation.  Reviewed information provided with referral including primary care office visit dated 12/18/2019.  At that time noted patient is due for colonoscopy.  Noted history of severe persistent asthma, uncomplicated with good management..  No history of colonoscopy found in our system.  Today she states she's doing ok overall. Was having bilateral LQ discomfort; had appendectomy and LLQ pain improved. Still with RLQ discomfort. Was previously having good bowel movements, now with more constipation and incomplete bowel movements. Was on stool softener initially but not any further. Denies N/V, hematochezia, melena, fever, chills, unintentional weight loss. Denies URI or flu-like symptoms. Denies loss of sense of taste or smell. Was tested for COVID-19 a few weeks ago and was negative. Has gotten both doses of Moderna COVID-19 vaccine. Breathing is at baseline. Denies chest pain, dizziness, lightheadedness, syncope, near syncope. Denies any other upper or lower GI symptoms.  At the end of her visit she states she had a previous colonoscopy with Dr. Britta Mccreedy in [place], [place] where they found polyps and recommended 5 year repeat exam.  Past Medical History:  Diagnosis Date  . Abnormal EKG   . Anxiety   . Fatigue   . Heat intolerance   . Jaw pain   . Migraine   . Renal disorder     Past Surgical History:  Procedure Laterality Date  . BRAIN SURGERY    . CESAREAN SECTION    . kidnsey stones     . laproscopy      Current Outpatient Medications  Medication Sig Dispense Refill  . aspirin 81 MG tablet Take 81 mg by  mouth daily.      . clarithromycin (BIAXIN) 500 MG tablet Take 500 mg by mouth 2 (two) times daily. Takes 2 pills on Mon, Weds, Fridays    . Dupilumab (DUPIXENT) 300 MG/2ML SOPN Inject into the skin. Injection q 2 weeks    . FLUoxetine (PROZAC) 20 MG capsule Take 1 capsule (20 mg total) by mouth daily. 90 capsule 4  . fluticasone (FLONASE) 50 MCG/ACT nasal spray     . Multiple Vitamin (MULTIVITAMIN WITH MINERALS) TABS Take 1 tablet by mouth daily.    . predniSONE (DELTASONE) 10 MG tablet Take 10 mg by mouth 2 (two) times daily with a meal.    . gabapentin (NEURONTIN) 300 MG capsule     . HYDROcodone-acetaminophen (NORCO/VICODIN) 5-325 MG per tablet Take 1 tablet by mouth every 6 (six) hours as needed.     Marland Kitchen losartan-hydrochlorothiazide (HYZAAR) 100-25 MG per tablet Take 1 tablet by mouth daily. 90 tablet 0  . oseltamivir (TAMIFLU) 75 MG capsule Take 1 capsule (75 mg total) by mouth 2 (two) times daily. (Patient not taking: Reported on 01/15/2020) 10 capsule 0  . traMADol (ULTRAM) 50 MG tablet      No current facility-administered medications for this visit.    Allergies as of 01/15/2020 - Review Complete 01/15/2020  Allergen Reaction Noted  . Phenobarbital Itching and Rash 04/14/2011    Family History  Problem Relation Age of Onset  . Colon cancer Neg Hx  Social History   Socioeconomic History  . Marital status: Married    Spouse name: Not on file  . Number of children: Not on file  . Years of education: Not on file  . Highest education level: Not on file  Occupational History  . Not on file  Tobacco Use  . Smoking status: Never Smoker  . Smokeless tobacco: Never Used  Substance and Sexual Activity  . Alcohol use: No  . Drug use: No  . Sexual activity: Yes  Other Topics Concern  . Not on file  Social History Narrative  . Not on file   Social Determinants of Health   Financial Resource Strain:   . Difficulty of Paying Living Expenses:   Food Insecurity:   .  Worried About Charity fundraiser in the Last Year:   . Arboriculturist in the Last Year:   Transportation Needs:   . Film/video editor (Medical):   Marland Kitchen Lack of Transportation (Non-Medical):   Physical Activity:   . Days of Exercise per Week:   . Minutes of Exercise per Session:   Stress:   . Feeling of Stress :   Social Connections:   . Frequency of Communication with Friends and Family:   . Frequency of Social Gatherings with Friends and Family:   . Attends Religious Services:   . Active Member of Clubs or Organizations:   . Attends Archivist Meetings:   Marland Kitchen Marital Status:   Intimate Partner Violence:   . Fear of Current or Ex-Partner:   . Emotionally Abused:   Marland Kitchen Physically Abused:   . Sexually Abused:     Review of Systems  Constitutional: Negative for chills, fever, malaise/fatigue and weight loss.  HENT: Negative for congestion and sore throat.   Respiratory: Positive for shortness of breath. Negative for cough.        Baseline with asthma  Cardiovascular: Negative for chest pain and palpitations.  Gastrointestinal: Positive for abdominal pain. Negative for blood in stool, diarrhea, melena, nausea and vomiting.       LLQ  Musculoskeletal: Negative for joint pain and myalgias.  Skin: Negative for rash.  Neurological: Negative for dizziness and weakness.  Endo/Heme/Allergies: Does not bruise/bleed easily.  Psychiatric/Behavioral: Negative for depression. The patient is not nervous/anxious.   All other systems reviewed and are negative.     VS: BP (!) 150/74   Pulse 83   Temp (!) 97.1 F (36.2 C) (Oral)   Ht _0  (1.549 m)   Wt 207 lb 3.2 oz (94 kg)   BMI 39.15 kg/m  Physical Exam Vitals and nursing note reviewed.  Constitutional:      General: She is not in acute distress.    Appearance: She is well-developed. She is not ill-appearing, toxic-appearing or diaphoretic.  HENT:     Head: Normocephalic and atraumatic.  Cardiovascular:     Rate  and Rhythm: Normal rate and regular rhythm.     Heart sounds: Normal heart sounds.  Pulmonary:     Effort: Pulmonary effort is normal. No respiratory distress or retractions.     Breath sounds: Normal breath sounds and air entry. No stridor or decreased air movement.  Abdominal:     General: Abdomen is protuberant. Bowel sounds are normal. There is no distension.     Palpations: Abdomen is soft. There is no hepatomegaly, splenomegaly or mass.     Tenderness: There is no abdominal tenderness. There is no guarding or rebound.  Hernia: No hernia is present.  Skin:    General: Skin is warm and dry.     Coloration: Skin is not jaundiced.     Findings: No rash.  Neurological:     General: No focal deficit present.     Mental Status: She is alert and oriented to person, place, and time.  Psychiatric:        Attention and Perception: Attention normal.        Mood and Affect: Mood normal.        Speech: Speech normal.        Behavior: Behavior normal.        Thought Content: Thought content normal.        Cognition and Memory: Cognition and memory normal.       01/15/2020 3:35 PM   Disclaimer: This note was dictated with voice recognition software. Similar sounding words can inadvertently be transcribed and may not be corrected upon review.

## 2020-01-15 NOTE — Patient Instructions (Signed)
Your health issues we discussed today were:   Constipation: 1. Start taking Colace 100 mg over-the-counter stool softener once a day, every day 2. You can add MiraLAX one dose/capful once a day, if needed for persistent constipation 3. Call us if you have any worsening or severe symptoms  Previous colon polyps: 1. As we discussed, I will request your previous colonoscopy records from [place], [place] 2. We will proceed with colonoscopy at this time 3. Further recommendations will follow your colonoscopy  Overall I recommend:  1. Continue your other current medications 2. Return for follow-up based on recommendations made after colonoscopy 3. Call us if you have any questions or concerns   ---------------------------------------------------------------  I am glad you got your COVID-19 vaccinations excavation point even though you are vaccinated you should continue to wear a mask, and socially distance 6 feet from people you do not live with her closely associated with regularly.  Continue to wash your hands frequently.  ---------------------------------------------------------------   At [facility] we value your feedback. You may receive a survey about your visit today. Please share your experience as we strive to create trusting relationships with our patients to provide genuine, compassionate, quality care.  We appreciate your understanding and patience as we review any laboratory studies, imaging, and other diagnostic tests that are ordered as we care for you. Our office policy is 5 business days for review of these results, and any emergent or urgent results are addressed in a timely manner for your best interest. If you do not hear from our office in 1 week, please contact us.   We also encourage the use of MyChart, which contains your medical information for your review as well. If you are not enrolled in this feature, an access code is on this after visit  summary for your convenience. Thank you for allowing [place] to be involved in your care.  It was great to see you today!  I hope you have a great day!!

## 2020-01-15 NOTE — Assessment & Plan Note (Signed)
The patient describes constipation mostly with incomplete bowel movements.  This initially started after surgery when she was on pain medication and she had a stool softener.  However, she only took this once or twice.  She is no longer on pain medication but still having incomplete emptying and left lower quadrant discomfort.  No worse on exam.  At this point I will have her start Colace stool softener once a day.  MiraLAX once a day as needed.  Follow-up based on post colonoscopy recommendations or if symptoms do not improve.

## 2020-01-15 NOTE — Assessment & Plan Note (Signed)
Noted history of colon polyps per the patient.  She states her last colonoscopy was in 2015 with Dr. Britta Mccreedy in [place], [place] when they found multiple polyps and was recommended to have a 5-year repeat exam.  Dr. Britta Mccreedy retired and she has requested referral to our office.  Based on her report, she is currently due for a colonoscopy.  We will request her previous colonoscopy records.  We will proceed with scheduling her colonoscopy at this time.  Further recommendations to follow her colonoscopy and follow-up based on those recommendations.  Proceed with TCS on propofol/MAC with Dr. Gala Romney in near future: the risks, benefits, and alternatives have been discussed with the patient in detail. The patient states understanding and desires to proceed.  The patient is currently on Prozac.  Denies alcohol and drug use.  She is quite anxious today. The patient is not on any other anticoagulants, anxiolytics, chronic pain medications, antidepressants, antidiabetics, or iron supplements.  Given her medications and anxiety we will plan for the procedure on propofol/MAC to promote adequate sedation.

## 2020-01-16 ENCOUNTER — Encounter: Payer: Self-pay | Admitting: *Deleted

## 2020-01-19 DIAGNOSIS — J4550 Severe persistent asthma, uncomplicated: Secondary | ICD-10-CM | POA: Diagnosis not present

## 2020-01-19 DIAGNOSIS — J479 Bronchiectasis, uncomplicated: Secondary | ICD-10-CM | POA: Diagnosis not present

## 2020-04-08 NOTE — Patient Instructions (Signed)
Sabrina Mcdonald  04/08/2020     @PREFPERIOPPHARMACY @   Your procedure is scheduled on  04/14/2020   Report to 04/16/2020 at  [facility]  A.M.  Call this number if you have problems the morning of surgery:  [phone]   Remember:  Follow the diet and prep instructions given to you by Dr [phone] office.                       Take these medicines the morning of surgery with A SIP OF WATER  Prozac, gabapentin, hydrocodone(if needed), deltasone, tramadol(if needed).    Do not wear jewelry, make-up or nail polish.  Do not wear lotions, powders, or perfumes, or deodorant.  Do not shave 48 hours prior to surgery.  Men may shave face and neck.  Do not bring valuables to the hospital.  [facility] is not responsible for any belongings or valuables.  Contacts, dentures or bridgework may not be worn into surgery.  Leave your suitcase in the car.  After surgery it may be brought to your room.  For patients admitted to the hospital, discharge time will be determined by your treatment team.  Patients discharged the day of surgery will not be allowed to drive home.   Name and phone number of your driver:   family Special instructions:   DO NOT smoke the morning of your procedure.  Please read over the following fact sheets that you were given. Anesthesia Post-op Instructions and Care and Recovery After Surgery       Colonoscopy, Adult, Care After This sheet gives you information about how to care for yourself after your procedure. Your health care provider may also give you more specific instructions. If you have problems or questions, contact your health care provider. What can I expect after the procedure? After the procedure, it is common to have:  A small amount of blood in your stool for 24 hours after the procedure.  Some gas.  Mild cramping or bloating of your abdomen. Follow these instructions at home: Eating and drinking   Drink enough fluid to keep your urine pale  yellow.  Follow instructions from your health care provider about eating or drinking restrictions.  Resume your normal diet as instructed by your health care provider. Avoid heavy or fried foods that are hard to digest. Activity  Rest as told by your health care provider.  Avoid sitting for a long time without moving. Get up to take short walks every 1-2 hours. This is important to improve blood flow and breathing. Ask for help if you feel weak or unsteady.  Return to your normal activities as told by your health care provider. Ask your health care provider what activities are safe for you. Managing cramping and bloating   Try walking around when you have cramps or feel bloated.  Apply heat to your abdomen as told by your health care provider. Use the heat source that your health care provider recommends, such as a moist heat pack or a heating pad. ? Place a towel between your skin and the heat source. ? Leave the heat on for 20-30 minutes. ? Remove the heat if your skin turns bright red. This is especially important if you are unable to feel pain, heat, or cold. You may have a greater risk of getting burned. General instructions  For the first 24 hours after the procedure: ? Do not drive or use machinery. ?  Do not sign important documents. ? Do not drink alcohol. ? Do your regular daily activities at a slower pace than normal. ? Eat soft foods that are easy to digest.  Take over-the-counter and prescription medicines only as told by your health care provider.  Keep all follow-up visits as told by your health care provider. This is important. Contact a health care provider if:  You have blood in your stool 2-3 days after the procedure. Get help right away if you have:  More than a small spotting of blood in your stool.  Large blood clots in your stool.  Swelling of your abdomen.  Nausea or vomiting.  A fever.  Increasing pain in your abdomen that is not relieved with  medicine. Summary  After the procedure, it is common to have a small amount of blood in your stool. You may also have mild cramping and bloating of your abdomen.  For the first 24 hours after the procedure, do not drive or use machinery, sign important documents, or drink alcohol.  Get help right away if you have a lot of blood in your stool, nausea or vomiting, a fever, or increased pain in your abdomen. This information is not intended to replace advice given to you by your health care provider. Make sure you discuss any questions you have with your health care provider. Document Revised: 05/07/2019 Document Reviewed: 05/07/2019 Elsevier Patient Education  [place] After These instructions provide you with information about caring for yourself after your procedure. Your health care provider may also give you more specific instructions. Your treatment has been planned according to current medical practices, but problems sometimes occur. Call your health care provider if you have any problems or questions after your procedure. What can I expect after the procedure? After your procedure, you may:  Feel sleepy for several hours.  Feel clumsy and have poor balance for several hours.  Feel forgetful about what happened after the procedure.  Have poor judgment for several hours.  Feel nauseous or vomit.  Have a sore throat if you had a breathing tube during the procedure. Follow these instructions at home: For at least 24 hours after the procedure:      Have a responsible adult stay with you. It is important to have someone help care for you until you are awake and alert.  Rest as needed.  Do not: ? Participate in activities in which you could fall or become injured. ? Drive. ? Use heavy machinery. ? Drink alcohol. ? Take sleeping pills or medicines that cause drowsiness. ? Make important decisions or sign legal documents. ? Take care  of children on your own. Eating and drinking  Follow the diet that is recommended by your health care provider.  If you vomit, drink water, juice, or soup when you can drink without vomiting.  Make sure you have little or no nausea before eating solid foods. General instructions  Take over-the-counter and prescription medicines only as told by your health care provider.  If you have sleep apnea, surgery and certain medicines can increase your risk for breathing problems. Follow instructions from your health care provider about wearing your sleep device: ? Anytime you are sleeping, including during daytime naps. ? While taking prescription pain medicines, sleeping medicines, or medicines that make you drowsy.  If you smoke, do not smoke without supervision.  Keep all follow-up visits as told by your health care provider. This is important. Contact a health  care provider if:  You keep feeling nauseous or you keep vomiting.  You feel light-headed.  You develop a rash.  You have a fever. Get help right away if:  You have trouble breathing. Summary  For several hours after your procedure, you may feel sleepy and have poor judgment.  Have a responsible adult stay with you for at least 24 hours or until you are awake and alert. This information is not intended to replace advice given to you by your health care provider. Make sure you discuss any questions you have with your health care provider. Document Revised: 01/09/2018 Document Reviewed: 02/01/2016 Elsevier Patient Education  [place].

## 2020-04-10 ENCOUNTER — Encounter (HOSPITAL_COMMUNITY): Payer: Self-pay

## 2020-04-10 ENCOUNTER — Other Ambulatory Visit: Payer: Self-pay

## 2020-04-10 ENCOUNTER — Encounter (HOSPITAL_COMMUNITY)
Admission: RE | Admit: 2020-04-10 | Discharge: 2020-04-10 | Disposition: A | Discharge status: Home / Self Care | Payer: BC Managed Care – PPO | Source: Ambulatory Visit | Attending: Internal Medicine | Admitting: Internal Medicine

## 2020-04-10 ENCOUNTER — Other Ambulatory Visit (HOSPITAL_COMMUNITY)
Admission: RE | Admit: 2020-04-10 | Discharge: 2020-04-10 | Disposition: A | Discharge status: Home / Self Care | Payer: BC Managed Care – PPO | Source: Ambulatory Visit | Attending: Internal Medicine | Admitting: Internal Medicine

## 2020-04-10 DIAGNOSIS — Z20822 Contact with and (suspected) exposure to covid-19: Secondary | ICD-10-CM | POA: Diagnosis not present

## 2020-04-10 DIAGNOSIS — Z01812 Encounter for preprocedural laboratory examination: Secondary | ICD-10-CM | POA: Diagnosis not present

## 2020-04-10 LAB — SARS CORONAVIRUS 2 (TAT 6-24 HRS): SARS Coronavirus 2: NEGATIVE

## 2020-04-11 ENCOUNTER — Encounter (HOSPITAL_COMMUNITY): Payer: Self-pay

## 2020-04-14 ENCOUNTER — Ambulatory Visit (HOSPITAL_COMMUNITY)
Admission: RE | Admit: 2020-04-14 | Discharge: 2020-04-14 | Disposition: A | Discharge status: Home / Self Care | Payer: BC Managed Care – PPO | Attending: Internal Medicine | Admitting: Internal Medicine

## 2020-04-14 ENCOUNTER — Ambulatory Visit (HOSPITAL_COMMUNITY): Payer: BC Managed Care – PPO | Admitting: Anesthesiology

## 2020-04-14 ENCOUNTER — Other Ambulatory Visit: Payer: Self-pay

## 2020-04-14 ENCOUNTER — Encounter (HOSPITAL_COMMUNITY)
Admission: RE | Disposition: A | Discharge status: Home / Self Care | Payer: Self-pay | Source: Home / Self Care | Attending: Internal Medicine

## 2020-04-14 ENCOUNTER — Encounter (HOSPITAL_COMMUNITY): Payer: Self-pay | Admitting: Internal Medicine

## 2020-04-14 DIAGNOSIS — Z7982 Long term (current) use of aspirin: Secondary | ICD-10-CM | POA: Diagnosis not present

## 2020-04-14 DIAGNOSIS — F419 Anxiety disorder, unspecified: Secondary | ICD-10-CM | POA: Insufficient documentation

## 2020-04-14 DIAGNOSIS — Z7952 Long term (current) use of systemic steroids: Secondary | ICD-10-CM | POA: Diagnosis not present

## 2020-04-14 DIAGNOSIS — K640 First degree hemorrhoids: Secondary | ICD-10-CM | POA: Insufficient documentation

## 2020-04-14 DIAGNOSIS — Z09 Encounter for follow-up examination after completed treatment for conditions other than malignant neoplasm: Secondary | ICD-10-CM | POA: Diagnosis not present

## 2020-04-14 DIAGNOSIS — Z86010 Personal history of colonic polyps: Secondary | ICD-10-CM | POA: Insufficient documentation

## 2020-04-14 DIAGNOSIS — K5730 Diverticulosis of large intestine without perforation or abscess without bleeding: Secondary | ICD-10-CM | POA: Diagnosis not present

## 2020-04-14 DIAGNOSIS — Z1211 Encounter for screening for malignant neoplasm of colon: Secondary | ICD-10-CM | POA: Insufficient documentation

## 2020-04-14 DIAGNOSIS — Z79899 Other long term (current) drug therapy: Secondary | ICD-10-CM | POA: Insufficient documentation

## 2020-04-14 HISTORY — PX: COLONOSCOPY WITH PROPOFOL: SHX5780

## 2020-04-14 SURGERY — COLONOSCOPY WITH PROPOFOL
Anesthesia: General

## 2020-04-14 MED ORDER — LACTATED RINGERS IV SOLN: INTRAVENOUS | Status: DC | PRN

## 2020-04-14 MED ORDER — LIDOCAINE HCL (CARDIAC) PF 50 MG/5ML IV SOSY
PREFILLED_SYRINGE | INTRAVENOUS | Status: DC | PRN
  Administered 2020-04-14: 100 mg via INTRAVENOUS

## 2020-04-14 MED ORDER — LACTATED RINGERS IV SOLN: Freq: Once | INTRAVENOUS | Status: AC

## 2020-04-14 MED ORDER — CHLORHEXIDINE GLUCONATE CLOTH 2 % EX PADS: 6.0000 | MEDICATED_PAD | Freq: Once | CUTANEOUS | Status: DC

## 2020-04-14 MED ORDER — PROPOFOL 500 MG/50ML IV EMUL
INTRAVENOUS | Status: DC | PRN
  Administered 2020-04-14: 150 ug/kg/min via INTRAVENOUS
  Administered 2020-04-14: 200 ug/kg/min via INTRAVENOUS

## 2020-04-14 MED ORDER — PROPOFOL 10 MG/ML IV BOLUS
INTRAVENOUS | Status: DC | PRN
  Administered 2020-04-14: 20 mg via INTRAVENOUS
  Administered 2020-04-14: 60 mg via INTRAVENOUS

## 2020-04-14 NOTE — Op Note (Signed)
[facility] Patient Name: Sabrina Mcdonald Procedure Date: 04/14/2020 7:16 AM MRN: [id number] Date of Birth: [birth year] Attending MD: Gennette Pac , MD CSN: [id number] Age: 64 Admit Type: Outpatient Procedure:                Colonoscopy Indications:              High risk colon cancer surveillance: Personal                            history of colonic polyps Providers:                Gennette Pac, MD, Crystal Page, Edrick Kins, RN, Edythe Clarity, Technician Referring MD:              Medicines:                Propofol per Anesthesia Complications:            No immediate complications. Estimated Blood Loss:     Estimated blood loss: none. Procedure:                Pre-Anesthesia Assessment:                           - Prior to the procedure, a History and Physical                            was performed, and patient medications and                            allergies were reviewed. The patient's tolerance of                            previous anesthesia was also reviewed. The risks                            and benefits of the procedure and the sedation                            options and risks were discussed with the patient.                            All questions were answered, and informed consent                            was obtained. Prior Anticoagulants: The patient has                            taken no previous anticoagulant or antiplatelet                            agents. ASA Grade Assessment: II - A patient with  mild systemic disease. After reviewing the risks                            and benefits, the patient was deemed in                            satisfactory condition to undergo the procedure.                           After obtaining informed consent, the colonoscope                            was passed under direct vision. Throughout the                            procedure, the  patient's blood pressure, pulse, and                            oxygen saturations were monitored continuously. The                            CF-[id number] ([id number]) scope was introduced through                            the anus and advanced to the the cecum, identified                            by appendiceal orifice and ileocecal valve. The                            colonoscopy was performed without difficulty. The                            patient tolerated the procedure well. The quality                            of the bowel preparation was adequate. Scope In: 7:41:32 AM Scope Out: 7:54:31 AM Scope Withdrawal Time: 0 hours 8 minutes 25 seconds  Total Procedure Duration: 0 hours 12 minutes 59 seconds  Findings:      The perianal and digital rectal examinations were normal.      Scattered medium-mouthed diverticula were found in the sigmoid colon and       descending colon.      Non-bleeding internal hemorrhoids were found during retroflexion. The       hemorrhoids were moderate, medium-sized and Grade I (internal       hemorrhoids that do not prolapse).      The exam was otherwise without abnormality on direct and retroflexion       views. Impression:               - Diverticulosis in the sigmoid colon and in the                            descending colon.                           -  Non-bleeding internal hemorrhoids.                           - The examination was otherwise normal on direct                            and retroflexion views.                           - No specimens collected. Moderate Sedation:      Moderate (conscious) sedation was personally administered by an       anesthesia professional. The following parameters were monitored: oxygen       saturation, heart rate, blood pressure, respiratory rate, EKG, adequacy       of pulmonary ventilation, and response to care. Recommendation:           - Written discharge instructions were provided to                             the patient.                           - The signs and symptoms of potential delayed                            complications were discussed with the patient.                           - Patient has a contact number available for                            emergencies.                           - Return to normal activities tomorrow.                           - Advance diet as tolerated.                           - Continue present medications.                           - Repeat colonoscopy in 5 years for surveillance.                           - Return to GI office (date not yet determined). Procedure Code(s):        --- Professional ---                           [phone], Colonoscopy, flexible; diagnostic, including                            collection of specimen(s) by brushing or washing,                            when performed (separate  procedure) Diagnosis Code(s):        --- Professional ---                           Z86.010, Personal history of colonic polyps                           K64.0, First degree hemorrhoids                           K57.30, Diverticulosis of large intestine without                            perforation or abscess without bleeding CPT copyright 2019 American Medical Association. All rights reserved. The codes documented in this report are preliminary and upon coder review may  be revised to meet current compliance requirements. Gerrit Friends. Adelaide Pfefferkorn, MD Gennette Pac, MD 04/14/2020 8:51:55 AM This report has been signed electronically. Number of Addenda: 0

## 2020-04-14 NOTE — Anesthesia Procedure Notes (Signed)
Date/Time: 04/14/2020 7:33 AM Performed by: Franco Nones, CRNA Pre-anesthesia Checklist: Patient identified, Emergency Drugs available, Suction available, Timeout performed and Patient being monitored Patient Re-evaluated:Patient Re-evaluated prior to induction Oxygen Delivery Method: Non-rebreather mask

## 2020-04-14 NOTE — Transfer of Care (Signed)
Immediate Anesthesia Transfer of Care Note  Patient: Sabrina Mcdonald  Procedure(s) Performed: COLONOSCOPY WITH PROPOFOL (N/A )  Patient Location: PACU  Anesthesia Type:General  Level of Consciousness: awake and patient cooperative  Airway & Oxygen Therapy: Patient Spontanous Breathing  Post-op Assessment: Report given to RN and Post -op Vital signs reviewed and stable  Post vital signs: Reviewed and stable  Last Vitals:  Vitals Value Taken Time  BP 120/62 04/14/20 0800  Temp 98   Pulse 71 04/14/20 0801  Resp 12 04/14/20 0801  SpO2 97 % 04/14/20 0801  Vitals shown include unvalidated device data.  Last Pain:  Vitals:   04/14/20 0735  TempSrc:   PainSc: 0-No pain      Patients Stated Pain Goal: 7 (04/14/20 2217)  Complications: No complications documented.

## 2020-04-14 NOTE — H&P (Signed)
@LOGO @   Primary Care Physician:  Allayne Butcher, NP Primary Gastroenterologist:  Dr. Gala Romney  Pre-Procedure History & Physical: HPI:  Sabrina Mcdonald is a 64 y.o. female here for for surveillance colonoscopy.  History of multiple colonic polyps removed elsewhere (further details unknown).  No GI symptoms currently.  Past Medical History:  Diagnosis Date  . Abnormal EKG   . Anxiety   . Fatigue   . Heat intolerance   . Jaw pain   . Migraine   . Renal disorder     Past Surgical History:  Procedure Laterality Date  . APPENDECTOMY    . BRAIN SURGERY    . CESAREAN SECTION    . kidnsey stones     . laproscopy      Prior to Admission medications   Medication Sig Start Date End Date Taking? Authorizing Provider  acetaminophen (TYLENOL) 325 MG tablet Take 325 mg by mouth daily.   Yes [provider]  albuterol (VENTOLIN HFA) 108 (90 Base) MCG/ACT inhaler Inhale 1-2 puffs into the lungs every 6 (six) hours as needed for wheezing or shortness of breath. 04/04/20  Yes [provider]  aspirin EC 81 MG tablet Take 81 mg by mouth daily. Swallow whole.   Yes [provider]  BROVANA 15 MCG/2ML NEBU Take 15 mcg by nebulization daily. 03/04/20  Yes [provider]  buPROPion (WELLBUTRIN XL) 150 MG 24 hr tablet Take 150 mg by mouth daily. 02/25/20  Yes [provider]  busPIRone (BUSPAR) 5 MG tablet Take 5 mg by mouth 2 (two) times daily as needed for anxiety. 11/21/19  Yes [provider]  clarithromycin (BIAXIN) 500 MG tablet Take 500 mg by mouth See admin instructions. Take 1 tablet (500 mg) by mouth twice daily on Mondays, Wednesdays, & Fridays.   Yes [provider]  DUPIXENT 300 MG/2ML prefilled syringe Inject 300 mg into the skin every 14 (fourteen) days. 03/18/20  Yes [provider]  escitalopram (LEXAPRO) 20 MG tablet Take 20 mg by mouth at bedtime. 03/28/20  Yes [provider]  fluticasone (FLONASE) 50 MCG/ACT  nasal spray Place 1 spray into both nostrils daily.  04/08/13  Yes [provider]  hydrOXYzine (ATARAX/VISTARIL) 25 MG tablet Take 25 mg by mouth 3 (three) times daily as needed for anxiety. 04/03/20  Yes [provider]  ibuprofen (ADVIL) 200 MG tablet Take 200 mg by mouth daily.   Yes [provider]  KLOR-CON M20 20 MEQ tablet Take 20 mEq by mouth daily. 03/27/20  Yes [provider]  loratadine (CLARITIN) 10 MG tablet Take 20 mg by mouth at bedtime.   Yes [provider]  losartan-hydrochlorothiazide (HYZAAR) 100-25 MG per tablet Take 1 tablet by mouth daily. 07/11/13 04/09/21 Yes Doran Heater, MD  montelukast (SINGULAIR) 10 MG tablet Take 10 mg by mouth at bedtime. 03/05/20  Yes [provider]  predniSONE (DELTASONE) 5 MG tablet Take 5 mg by mouth in the morning and at bedtime.   Yes [provider]  Vitamin D, Ergocalciferol, (DRISDOL) 1.25 MG (50000 UNIT) CAPS capsule Take 50,000 Units by mouth every Sunday. 03/27/20  Yes [provider]    Allergies as of 01/15/2020 - Review Complete 01/15/2020  Allergen Reaction Noted  . Phenobarbital Itching and Rash 04/14/2011    Family History  Problem Relation Age of Onset  . Colon cancer Neg Hx     Social History   Socioeconomic History  . Marital status: Married  Spouse name: Not on file  . Number of children: Not on file  . Years of education: Not on file  . Highest education level: Not on file  Occupational History  . Not on file  Tobacco Use  . Smoking status: Never Smoker  . Smokeless tobacco: Never Used  Substance and Sexual Activity  . Alcohol use: No  . Drug use: No  . Sexual activity: Yes  Other Topics Concern  . Not on file  Social History Narrative  . Not on file   Social Determinants of Health   Financial Resource Strain:   . Difficulty of Paying Living Expenses:   Food Insecurity:   . Worried About Programme researcher, broadcasting/film/video in the Last Year:   .  Barista in the Last Year:   Transportation Needs:   . Freight forwarder (Medical):   Marland Kitchen Lack of Transportation (Non-Medical):   Physical Activity:   . Days of Exercise per Week:   . Minutes of Exercise per Session:   Stress:   . Feeling of Stress :   Social Connections:   . Frequency of Communication with Friends and Family:   . Frequency of Social Gatherings with Friends and Family:   . Attends Religious Services:   . Active Member of Clubs or Organizations:   . Attends Banker Meetings:   Marland Kitchen Marital Status:   Intimate Partner Violence:   . Fear of Current or Ex-Partner:   . Emotionally Abused:   Marland Kitchen Physically Abused:   . Sexually Abused:     Review of Systems: See HPI, otherwise negative ROS  Physical Exam: BP 140/87   Pulse 79   Temp 98.3 F (36.8 C) (Oral)   Resp 17   Ht 5' (1.524 m)   SpO2 95%   BMI 40.47 kg/m  General:   Alert,  Well-developed, well-nourished, pleasant and cooperative in NAD Lungs:  Clear throughout to auscultation.   No wheezes, crackles, or rhonchi. No acute distress. Heart:  Regular rate and rhythm; no murmurs, clicks, rubs,  or gallops. Abdomen: Non-distended, normal bowel sounds.  Soft and nontender without appreciable mass or hepatosplenomegaly.  Pulses:  Normal pulses noted. Extremities:  Without clubbing or edema.  Impression/Plan: 64 year old lady here for surveillance colonoscopy.  History of colonic polyps. The risks, benefits, limitations, alternatives and imponderables have been reviewed with the patient. Questions have been answered. All parties are agreeable.      Notice: This dictation was prepared with Dragon dictation along with smaller phrase technology. Any transcriptional errors that result from this process are unintentional and may not be corrected upon review.

## 2020-04-14 NOTE — Discharge Instructions (Signed)
Colonoscopy Discharge Instructions  Read the instructions outlined below and refer to this sheet in the next few weeks. These discharge instructions provide you with general information on caring for yourself after you leave the hospital. Your doctor may also give you specific instructions. While your treatment has been planned according to the most current medical practices available, unavoidable complications occasionally occur. If you have any problems or questions after discharge, call Dr. Jena Gauss at [phone]. ACTIVITY  You may resume your regular activity, but move at a slower pace for the next 24 hours.   Take frequent rest periods for the next 24 hours.   Walking will help get rid of the air and reduce the bloated feeling in your belly (abdomen).   No driving for 24 hours (because of the medicine (anesthesia) used during the test).    Do not sign any important legal documents or operate any machinery for 24 hours (because of the anesthesia used during the test).  NUTRITION  Drink plenty of fluids.   You may resume your normal diet as instructed by your doctor.   Begin with a light meal and progress to your normal diet. Heavy or fried foods are harder to digest and may make you feel sick to your stomach (nauseated).   Avoid alcoholic beverages for 24 hours or as instructed.  MEDICATIONS  You may re ume your normal medications unless your doctor tells you otherwise.  WHAT YOU CAN EXPECT TODAY  Some feelings of bloating in the abdomen.   Passage of more gas than usual.   Spotting of blood in your stool or on the toilet paper.  IF YOU HAD POLYPS REMOVED DURING THE COLONOSCOPY:  No aspirin products for 7 days or as instructed.   No alcohol for 7 days or as instructed.   Eat a soft diet for the next 24 hours.  FINDING OUT THE RESULTS OF YOUR TEST Not all test results are available during your visit. If your test results are not back during the visit, make an appointment  with your caregiver to find out the results. Do not assume everything is normal if you have not heard from your caregiver or the medical facility. It is important for you to follow up on all of your test results.  SEEK IMMEDIATE MEDICAL ATTENTION IF:  You have more than a spotting of blood in your stool.   Your belly is swollen (abdominal distention).   You are nauseated or vomiting.   You have a temperature over 101.   You have abdominal pain or discomfort that is severe or gets worse throughout the day.   Hemorrhoid and diverticulosis information provided  Recommend repeat colonoscopy in 5 years  At patient request, I called Junior Pintor at [phone]   and reviewed results   Hemorrhoids Hemorrhoids are swollen veins in and around the rectum or anus. There are two types of hemorrhoids:  Internal hemorrhoids. These occur in the veins that are just inside the rectum. They may poke through to the outside and become irritated and painful.  External hemorrhoids. These occur in the veins that are outside the anus and can be felt as a painful swelling or hard lump near the anus. Most hemorrhoids do not cause serious problems, and they can be managed with home treatments such as diet and lifestyle changes. If home treatments do not help the symptoms, procedures can be done to shrink or remove the hemorrhoids. What are the causes? This condition is caused by increased pressure  in the anal area. This pressure may result from various things, including:  Constipation.  Straining to have a bowel movement.  Diarrhea.  Pregnancy.  Obesity.  Sitting for long periods of time.  Heavy lifting or other activity that causes you to strain.  Anal sex.  Riding a bike for a long period of time. What are the signs or symptoms? Symptoms of this condition include:  Pain.  Anal itching or irritation.  Rectal bleeding.  Leakage of stool (feces).  Anal swelling.  One or more lumps  around the anus. How is this diagnosed? This condition can often be diagnosed through a visual exam. Other exams or tests may also be done, such as:  An exam that involves feeling the rectal area with a gloved hand (digital rectal exam).  An exam of the anal canal that is done using a small tube (anoscope).  A blood test, if you have lost a significant amount of blood.  A test to look inside the colon using a flexible tube with a camera on the end (sigmoidoscopy or colonoscopy). How is this treated? This condition can usually be treated at home. However, various procedures may be done if dietary changes, lifestyle changes, and other home treatments do not help your symptoms. These procedures can help make the hemorrhoids smaller or remove them completely. Some of these procedures involve surgery, and others do not. Common procedures include:  Rubber band ligation. Rubber bands are placed at the base of the hemorrhoids to cut off their blood supply.  Sclerotherapy. Medicine is injected into the hemorrhoids to shrink them.  Infrared coagulation. A type of light energy is used to get rid of the hemorrhoids.  Hemorrhoidectomy surgery. The hemorrhoids are surgically removed, and the veins that supply them are tied off.  Stapled hemorrhoidopexy surgery. The surgeon staples the base of the hemorrhoid to the rectal wall. Follow these instructions at home: Eating and drinking   Eat foods that have a lot of fiber in them, such as whole grains, beans, nuts, fruits, and vegetables.  Ask your health care provider about taking products that have added fiber (fiber supplements).  Reduce the amount of fat in your diet. You can do this by eating low-fat dairy products, eating less red meat, and avoiding processed foods.  Drink enough fluid to keep your urine pale yellow. Managing pain and swelling   Take warm sitz baths for 20 minutes, 3-4 times a day to ease pain and discomfort. You may do this  in a bathtub or using a portable sitz bath that fits over the toilet.  If directed, apply ice to the affected area. Using ice packs between sitz baths may be helpful. ? Put ice in a plastic bag. ? Place a towel between your skin and the bag. ? Leave the ice on for 20 minutes, 2-3 times a day. General instructions  Take over-the-counter and prescription medicines only as told by your health care provider.  Use medicated creams or suppositories as told.  Get regular exercise. Ask your health care provider how much and what kind of exercise is best for you. In general, you should do moderate exercise for at least 30 minutes on most days of the week (150 minutes each week). This can include activities such as walking, biking, or yoga.  Go to the bathroom when you have the urge to have a bowel movement. Do not wait.  Avoid straining to have bowel movements.  Keep the anal area dry and clean. Use  wet toilet paper or moist towelettes after a bowel movement.  Do not sit on the toilet for long periods of time. This increases blood pooling and pain.  Keep all follow-up visits as told by your health care provider. This is important. Contact a health care provider if you have:  Increasing pain and swelling that are not controlled by treatment or medicine.  Difficulty having a bowel movement, or you are unable to have a bowel movement.  Pain or inflammation outside the area of the hemorrhoids. Get help right away if you have:  Uncontrolled bleeding from your rectum. Summary  Hemorrhoids are swollen veins in and around the rectum or anus.  Most hemorrhoids can be managed with home treatments such as diet and lifestyle changes.  Taking warm sitz baths can help ease pain and discomfort.  In severe cases, procedures or surgery can be done to shrink or remove the hemorrhoids. This information is not intended to replace advice given to you by your health care provider. Make sure you discuss  any questions you have with your health care provider. Document Revised: 03/09/2019 Document Reviewed: 03/02/2018 Elsevier Patient Education  2020 [employer].   Diverticulosis  Diverticulosis is a condition that develops when small pouches (diverticula) form in the wall of the large intestine (colon). The colon is where water is absorbed and stool (feces) is formed. The pouches form when the inside layer of the colon pushes through weak spots in the outer layers of the colon. You may have a few pouches or many of them. The pouches usually do not cause problems unless they become inflamed or infected. When this happens, the condition is called diverticulitis. What are the causes? The cause of this condition is not known. What increases the risk? The following factors may make you more likely to develop this condition:  Being older than age 29. Your risk for this condition increases with age. Diverticulosis is rare among people younger than age 74. By age 45, many people have it.  Eating a low-fiber diet.  Having frequent constipation.  Being overweight.  Not getting enough exercise.  Smoking.  Taking over-the-counter pain medicines, like aspirin and ibuprofen.  Having a family history of diverticulosis. What are the signs or symptoms? In most people, there are no symptoms of this condition. If you do have symptoms, they may include:  Bloating.  Cramps in the abdomen.  Constipation or diarrhea.  Pain in the lower left side of the abdomen. How is this diagnosed? Because diverticulosis usually has no symptoms, it is most often diagnosed during an exam for other colon problems. The condition may be diagnosed by:  Using a flexible scope to examine the colon (colonoscopy).  Taking an X-ray of the colon after dye has been put into the colon (barium enema).  Having a CT scan. How is this treated? You may not need treatment for this condition. Your health care provider may  recommend treatment to prevent problems. You may need treatment if you have symptoms or if you previously had diverticulitis. Treatment may include:  Eating a high-fiber diet.  Taking a fiber supplement.  Taking a live bacteria supplement (probiotic).  Taking medicine to relax your colon. Follow these instructions at home: Medicines  Take over-the-counter and prescription medicines only as told by your health care provider.  If told by your health care provider, take a fiber supplement or probiotic. Constipation prevention Your condition may cause constipation. To prevent or treat constipation, you may need to:  Drink enough fluid to keep your urine pale yellow.  Take over-the-counter or prescription medicines.  Eat foods that are high in fiber, such as beans, whole grains, and fresh fruits and vegetables.  Limit foods that are high in fat and processed sugars, such as fried or sweet foods.  General instructions  Try not to strain when you have a bowel movement.  Keep all follow-up visits as told by your health care provider. This is important. Contact a health care provider if you:  Have pain in your abdomen.  Have bloating.  Have cramps.  Have not had a bowel movement in 3 days. Get help right away if:  Your pain gets worse.  Your bloating becomes very bad.  You have a fever or chills, and your symptoms suddenly get worse.  You vomit.  You have bowel movements that are bloody or black.  You have bleeding from your rectum. Summary  Diverticulosis is a condition that develops when small pouches (diverticula) form in the wall of the large intestine (colon).  You may have a few pouches or many of them.  This condition is most often diagnosed during an exam for other colon problems.  Treatment may include increasing the fiber in your diet, taking supplements, or taking medicines. This information is not intended to replace advice given to you by your health  care provider. Make sure you discuss any questions you have with your health care provider. Document Revised: 05/10/2019 Document Reviewed: 05/10/2019 Elsevier Patient Education  [employer].

## 2020-04-14 NOTE — Anesthesia Preprocedure Evaluation (Addendum)
Anesthesia Evaluation  Patient identified by MRN, date of birth, ID band Patient awake    Reviewed: Allergy & Precautions, NPO status , Patient's Chart, lab work & pertinent test results  History of Anesthesia Complications Negative for: history of anesthetic complications  Airway Mallampati: III  TM Distance: >3 FB Neck ROM: Limited   Comment: Neck fusion Dental  (+) Missing, Chipped, Poor Dentition, Dental Advisory Given   Pulmonary shortness of breath and with exertion, pneumonia (bronchiectasis ),    Pulmonary exam normal breath sounds clear to auscultation       Cardiovascular Exercise Tolerance: Poor Normal cardiovascular exam Rhythm:Regular Rate:Normal     Neuro/Psych  Headaches, Anxiety    GI/Hepatic   Endo/Other    Renal/GU Renal disease     Musculoskeletal  (+) Arthritis  (neck fusion),   Abdominal   Peds  Hematology negative hematology ROS (+)   Anesthesia Other Findings   Reproductive/Obstetrics negative OB ROS                          Anesthesia Physical Anesthesia Plan  ASA: III  Anesthesia Plan: General   Post-op Pain Management:    Induction: Intravenous  PONV Risk Score and Plan: 2 and TIVA and Treatment may vary due to age or medical condition  Airway Management Planned: Nasal Cannula, Natural Airway and Simple Face Mask  Additional Equipment:   Intra-op Plan:   Post-operative Plan:   Informed Consent: I have reviewed the patients History and Physical, chart, labs and discussed the procedure including the risks, benefits and alternatives for the proposed anesthesia with the patient or authorized representative who has indicated his/her understanding and acceptance.     Dental advisory given  Plan Discussed with: CRNA and Surgeon  Anesthesia Plan Comments:        Anesthesia Quick Evaluation

## 2020-04-14 NOTE — Anesthesia Postprocedure Evaluation (Signed)
Anesthesia Post Note  Patient: Sabrina Mcdonald  Procedure(s) Performed: COLONOSCOPY WITH PROPOFOL (N/A )  Patient location during evaluation: PACU Anesthesia Type: General Level of consciousness: awake and alert and patient cooperative Pain management: satisfactory to patient Vital Signs Assessment: post-procedure vital signs reviewed and stable Respiratory status: spontaneous breathing Cardiovascular status: stable Postop Assessment: no apparent nausea or vomiting Anesthetic complications: no   No complications documented.   Last Vitals:  Vitals:   04/14/20 0658 04/14/20 0800  BP: 140/87 120/62  Pulse: 79 76  Resp: 17 15  Temp: 36.8 C (P) 36.7 C  SpO2: 95% 97%    Last Pain:  Vitals:   04/14/20 0735  TempSrc:   PainSc: 0-No pain                 Jalayne Ganesh

## 2020-04-15 ENCOUNTER — Encounter: Payer: Self-pay | Admitting: Neurology

## 2020-04-18 ENCOUNTER — Other Ambulatory Visit: Payer: Self-pay

## 2020-04-18 ENCOUNTER — Encounter (HOSPITAL_COMMUNITY): Payer: Self-pay | Admitting: Internal Medicine

## 2020-04-18 ENCOUNTER — Ambulatory Visit: Payer: BC Managed Care – PPO | Admitting: Neurology

## 2020-04-18 VITALS — BP 158/83 | HR 82 | Ht 60.0 in | Wt 202.6 lb

## 2020-04-18 DIAGNOSIS — Z9889 Other specified postprocedural states: Secondary | ICD-10-CM

## 2020-04-18 DIAGNOSIS — R413 Other amnesia: Secondary | ICD-10-CM

## 2020-04-18 NOTE — Patient Instructions (Signed)
1. Schedule MRI brain with and without contrast  2. Schedule Neurocognitive testing  3. Proceed with visit with Endocrinologist  4. Follow-up after tests, call for any changes   RECOMMENDATIONS FOR ALL PATIENTS WITH MEMORY PROBLEMS: 1. Continue to exercise (Recommend 30 minutes of walking everyday, or 3 hours every week) 2. Increase social interactions - continue going to [place] and enjoy social gatherings with friends and family 3. Eat healthy, avoid fried foods and eat more fruits and vegetables 4. Maintain adequate blood pressure, blood sugar, and blood cholesterol level. Reducing the risk of stroke and cardiovascular disease also helps promoting better memory. 5. Avoid stressful situations. Live a simple life and avoid aggravations. Organize your time and prepare for the next day in anticipation. 6. Sleep well, avoid any interruptions of sleep and avoid any distractions in the bedroom that may interfere with adequate sleep quality 7. Avoid sugar, avoid sweets as there is a strong link between excessive sugar intake, diabetes, and cognitive impairment We discussed the Mediterranean diet, which has been shown to help patients reduce the risk of progressive memory disorders and reduces cardiovascular risk. This includes eating fish, eat fruits and green leafy vegetables, nuts like almonds and hazelnuts, walnuts, and also use olive oil. Avoid fast foods and fried foods as much as possible. Avoid sweets and sugar as sugar use has been linked to worsening of memory function.

## 2020-04-18 NOTE — Progress Notes (Signed)
NEUROLOGY CONSULTATION NOTE  [name] MRN: [id number] DOB: January 06, 1956  Referring provider: Rema Jasmine, NP Primary care provider: Rema Jasmine, NP  Reason for consult:  Memory loss   Thank you for your kind referral of Sabrina Mcdonald for consultation of the above symptoms. Although her history is well known to you, please allow me to reiterate it for the purpose of our medical record. She is alone in the office today. Records and images were personally reviewed where available.   HISTORY OF PRESENT ILLNESS: This is a 64 year old right-handed woman with a history of hypertension, asthma, colloid cyst s/p resection in 1996, PTSD, depression, anxiety, presenting for evaluation of memory loss. She started noticing memory changes around 2 weeks after laparoscopic appendectomy for acute appendicitis in 11/2019.  She states she has always had a "photostatic" memory but has noticed changes since then. 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 She forgot to pay the payroll tax, which has never happened before. She misplaces things frequently. She lives with her husband and denies getting lost driving, no missed bills or medications. She states "I know something is not right." Since the surgery, she has also started having dizziness, walking side ways at times. She would feel weak-legged, and sometimes there is a spinning sensation. She had double vision when things were bad, but this has resolved. She had initial bloodwork and was found to have hypokalemia and started on potassium supplements. She was also found to have a low TSH, ultrasound showed enlarged thyroid with nodules. She is scheduled to see an endocrinologist soon. She used to have headaches prior to colloid cyst surgery, but now has a different type of headaches where suddenly her head hurts so bad she has to lay back down. There is  throbbing/pressure in the frontal regions, she is sensitive to lights and sounds and lies in a quiet dark room. No nausea/vomiting. Ibuprofen helps some, she takes Ibuprofen every other day for different body pains stating "I live in pain." She endorses neck pain. She has a little numbness in her legs and her feet stay cold all the time now. No bowel/bladder dysfunction, anosmia. She has had tremors for the past 45 years, her mother and maternal grandmother have tremors. The tremors have worsened the past few months as well. She denies any falls. Her father was diagnosed with Alzheimer's disease, her mother now also has memory issues. No history of significant head injuries or alcohol use. Mood is "okay, I have my moments." She endorses a history of PTSD. She usually gets 8 hours of sleep and feels rested in the morning.    Laboratory Data: 03/26/20: TSH 0.259, B12 417   PAST MEDICAL HISTORY: Past Medical History:  Diagnosis Date  . Abnormal EKG   . Anxiety   . Fatigue   . Heat intolerance   . Jaw pain   . Migraine   . Renal disorder     PAST SURGICAL HISTORY: Past Surgical History:  Procedure Laterality Date  . APPENDECTOMY    . BRAIN SURGERY    . CESAREAN SECTION    . COLONOSCOPY WITH PROPOFOL N/A 04/14/2020   Procedure: COLONOSCOPY WITH PROPOFOL;  Surgeon: Corbin Ade, MD;  [facility];  Service: Endoscopy;  Laterality: N/A;  7:30am  . kidnsey stones     . laproscopy      MEDICATIONS: Current  Outpatient Medications on File Prior to Visit  Medication Sig Dispense Refill  . acetaminophen (TYLENOL) 325 MG tablet Take 325 mg by mouth daily.    Marland Kitchen albuterol (VENTOLIN HFA) 108 (90 Base) MCG/ACT inhaler Inhale 1-2 puffs into the lungs every 6 (six) hours as needed for wheezing or shortness of breath.    Marland Kitchen aspirin EC 81 MG tablet Take 81 mg by mouth daily. Swallow whole.    Marland Kitchen BROVANA 15 MCG/2ML NEBU Take 15 mcg by nebulization daily.    Marland Kitchen buPROPion (WELLBUTRIN XL) 150 MG  24 hr tablet Take 150 mg by mouth daily.    . busPIRone (BUSPAR) 5 MG tablet Take 5 mg by mouth 2 (two) times daily as needed for anxiety.    . clarithromycin (BIAXIN) 500 MG tablet Take 500 mg by mouth See admin instructions. Take 1 tablet (500 mg) by mouth twice daily on Mondays, Wednesdays, & Fridays.    . DUPIXENT 300 MG/2ML prefilled syringe Inject 300 mg into the skin every 14 (fourteen) days.    Marland Kitchen escitalopram (LEXAPRO) 20 MG tablet Take 20 mg by mouth at bedtime.    . fluticasone (FLONASE) 50 MCG/ACT nasal spray Place 1 spray into both nostrils daily.     . hydrOXYzine (ATARAX/VISTARIL) 25 MG tablet Take 25 mg by mouth 3 (three) times daily as needed for anxiety.    Marland Kitchen ibuprofen (ADVIL) 200 MG tablet Take 200 mg by mouth daily.    Marland Kitchen KLOR-CON M20 20 MEQ tablet Take 20 mEq by mouth daily.    Marland Kitchen loratadine (CLARITIN) 10 MG tablet Take 20 mg by mouth at bedtime.    Marland Kitchen losartan-hydrochlorothiazide (HYZAAR) 100-25 MG per tablet Take 1 tablet by mouth daily. 90 tablet 0  . montelukast (SINGULAIR) 10 MG tablet Take 10 mg by mouth at bedtime.    . predniSONE (DELTASONE) 5 MG tablet Take 5 mg by mouth in the morning and at bedtime.    . Vitamin D, Ergocalciferol, (DRISDOL) 1.25 MG (50000 UNIT) CAPS capsule Take 50,000 Units by mouth every Sunday.     No current facility-administered medications on file prior to visit.    ALLERGIES: Allergies  Allergen Reactions  . Phenobarbital Itching and Rash    FAMILY HISTORY: Family History  Problem Relation Age of Onset  . Colon cancer Neg Hx     SOCIAL HISTORY: Social History   Socioeconomic History  . Marital status: Married    Spouse name: Not on file  . Number of children: Not on file  . Years of education: Not on file  . Highest education level: Not on file  Occupational History  . Not on file  Tobacco Use  . Smoking status: Never Smoker  . Smokeless tobacco: Never Used  Vaping Use  . Vaping Use: Never used  Substance and Sexual  Activity  . Alcohol use: No  . Drug use: No  . Sexual activity: Yes  Other Topics Concern  . Not on file  Social History Narrative   Right handed    Lies with husband    Social Determinants of Health   Financial Resource Strain:   . Difficulty of Paying Living Expenses:   Food Insecurity:   . Worried About Charity fundraiser in the Last Year:   . Arboriculturist in the Last Year:   Transportation Needs:   . Film/video editor (Medical):   Marland Kitchen Lack of Transportation (Non-Medical):   Physical Activity:   . Days of Exercise  per Week:   . Minutes of Exercise per Session:   Stress:   . Feeling of Stress :   Social Connections:   . Frequency of Communication with Friends and Family:   . Frequency of Social Gatherings with Friends and Family:   . Attends Religious Services:   . Active Member of Clubs or Organizations:   . Attends Banker Meetings:   Marland Kitchen Marital Status:   Intimate Partner Violence:   . Fear of Current or Ex-Partner:   . Emotionally Abused:   Marland Kitchen Physically Abused:   . Sexually Abused:     PHYSICAL EXAM: Vitals:   04/18/20 1257  BP: (!) 158/83  Pulse: 82  SpO2: 97%   General: No acute distress Head:  Normocephalic/atraumatic Skin/Extremities: No rash, no edema Neurological Exam: Mental status: alert and oriented to person, place, and time, no dysarthria or aphasia, Fund of knowledge is appropriate.  Recent and remote memory are intact.  Attention and concentration are normal.  SLUMS score 25/30 [employer] Mental Exam 04/18/2020  Weekday Correct 1  Current year 1  What state are we in? 1  Amount spent 1  Amount left 2  # of Animals 2  5 objects recall 4  Number series 2  Hour markers 2  Time correct 1  Placed X in triangle correctly 1  Largest Figure 1  Name of female 2  Date back to work 0  Type of work 2  State she lived in 2  Total score 25    Cranial nerves: CN I: not tested CN II: pupils equal, round and reactive  to light, visual fields intact CN III, IV, VI:  full range of motion, no nystagmus, no ptosis CN V: facial sensation intact CN VII: upper and lower face symmetric CN VIII: hearing intact to conversation CN IX, X: gag intact, uvula midline CN XI: sternocleidomastoid and trapezius muscles intact CN XII: tongue midline Bulk & Tone: normal, no fasciculations, no cogwheeling. Motor: 5/5 throughout with no pronator drift. Sensation: intact to light touch, cold, pin, vibration and joint position sense.  No extinction to double simultaneous stimulation.  Romberg test negative Deep Tendon Reflexes: +2 throughout Cerebellar: no incoordination on finger to nose testing Gait: narrow-based and steady, able to tandem walk adequately. Tremor: side to side head tremors, no tremors in extremities today   IMPRESSION: This is a 64 year old right-handed woman with a history of hypertension, asthma, colloid cyst s/p resection in 1996, PTSD, depression, anxiety, presenting for evaluation of memory loss. Neurological exam non-focal, she has a side to side head tremor. SLUMS score 25/30. We discussed different causes of memory loss, MRI brain with and without contrast and Neurocognitive testing will be ordered to further evaluate her symptoms. Follow-up with Endocrinology as scheduled. We discussed the importance of control of vascular risk factors, physical exercise, and brain stimulation exercises for brain health. Follow-up after tests, she knows to call for any changes.   Thank you for allowing me to participate in the care of this patient. Please do not hesitate to call for any questions or concerns.   Patrcia Dolly, M.D.  CC: Rema Jasmine, NP

## 2020-04-22 ENCOUNTER — Other Ambulatory Visit: Payer: Self-pay

## 2020-04-22 ENCOUNTER — Encounter: Payer: Self-pay | Admitting: Internal Medicine

## 2020-04-22 ENCOUNTER — Ambulatory Visit: Payer: BC Managed Care – PPO | Admitting: Internal Medicine

## 2020-04-22 VITALS — BP 144/82 | HR 85 | Ht 60.0 in | Wt 201.6 lb

## 2020-04-22 DIAGNOSIS — E042 Nontoxic multinodular goiter: Secondary | ICD-10-CM | POA: Diagnosis not present

## 2020-04-22 DIAGNOSIS — E0590 Thyrotoxicosis, unspecified without thyrotoxic crisis or storm: Secondary | ICD-10-CM | POA: Insufficient documentation

## 2020-04-22 NOTE — Patient Instructions (Signed)
-   Please stop by the lab today  

## 2020-04-22 NOTE — Progress Notes (Signed)
Name: Sabrina Mcdonald  MRN/ DOB: [id number], 1956/05/02    Age/ Sex: 64 y.o., female    PCP: Rema Jasmine, NP   Reason for Endocrinology Evaluation: MNG     Date of Initial Endocrinology Evaluation: 04/22/2020     HPI: Sabrina Mcdonald is a 64 y.o. female with a past medical history of bronchiectasis, Asthma   and depression . The patient presented for initial endocrinology clinic visit on 04/22/2020 for consultative assistance with her hyperthyroidism and MNG     Pt presented to her PCP with c/o fatigue, brain fogginess, and  feeling achy   Denies constipation but has depression and weight gain    Had emergency appendicitis 11/2019 , she required kCL since hospitalization  Currently on Kcl 20 mEq/day   She is on prednisone for lung disease, today was reduce to one tablet daily   Maternal family history with goiter     HISTORY:  Past Medical History:  Past Medical History:  Diagnosis Date  . Abnormal EKG   . Anxiety   . Fatigue   . Heat intolerance   . Jaw pain   . Migraine   . Renal disorder    Past Surgical History:  Past Surgical History:  Procedure Laterality Date  . APPENDECTOMY    . BRAIN SURGERY    . CESAREAN SECTION    . COLONOSCOPY WITH PROPOFOL N/A 04/14/2020   Procedure: COLONOSCOPY WITH PROPOFOL;  Surgeon: Corbin Ade, MD;  [facility];  Service: Endoscopy;  Laterality: N/A;  7:30am  . kidnsey stones     . laproscopy        Social History:  reports that she has never smoked. She has never used smokeless tobacco. She reports that she does not drink alcohol and does not use drugs.  Family History: family history is not on file.   HOME MEDICATIONS: Allergies as of 04/22/2020      Reactions   Phenobarbital Itching, Rash      Medication List       Accurate as of April 22, 2020  2:44 PM. If you have any questions, ask your nurse or doctor.        acetaminophen 325 MG tablet Commonly known as: TYLENOL Take 325 mg by  mouth daily.   albuterol 108 (90 Base) MCG/ACT inhaler Commonly known as: VENTOLIN HFA Inhale 1-2 puffs into the lungs every 6 (six) hours as needed for wheezing or shortness of breath.   aspirin EC 81 MG tablet Take 81 mg by mouth daily. Swallow whole.   Brovana 15 MCG/2ML Nebu Generic drug: arformoterol Take 15 mcg by nebulization daily.   buPROPion 150 MG 24 hr tablet Commonly known as: WELLBUTRIN XL Take 150 mg by mouth daily.   busPIRone 5 MG tablet Commonly known as: BUSPAR Take 5 mg by mouth 2 (two) times daily as needed for anxiety.   clarithromycin 500 MG tablet Commonly known as: BIAXIN Take 500 mg by mouth See admin instructions. Take 1 tablet (500 mg) by mouth twice daily on Mondays, Wednesdays, & Fridays.   Dupixent 300 MG/2ML prefilled syringe Generic drug: dupilumab Inject 300 mg into the skin every 14 (fourteen) days.   escitalopram 20 MG tablet Commonly known as: LEXAPRO Take 20 mg by mouth at bedtime.   fluticasone 50 MCG/ACT nasal spray Commonly known as: FLONASE Place 1 spray into both nostrils daily.   hydrOXYzine 25 MG tablet Commonly known as: ATARAX/VISTARIL Take 25 mg by mouth  3 (three) times daily as needed for anxiety.   ibuprofen 200 MG tablet Commonly known as: ADVIL Take 200 mg by mouth daily.   Klor-Con M20 20 MEQ tablet Generic drug: potassium chloride SA Take 20 mEq by mouth daily.   loratadine 10 MG tablet Commonly known as: CLARITIN Take 20 mg by mouth at bedtime.   losartan-hydrochlorothiazide 100-25 MG tablet Commonly known as: HYZAAR Take 1 tablet by mouth daily.   montelukast 10 MG tablet Commonly known as: SINGULAIR Take 10 mg by mouth at bedtime.   predniSONE 5 MG tablet Commonly known as: DELTASONE Take 5 mg by mouth daily with breakfast.   Vitamin D (Ergocalciferol) 1.25 MG (50000 UNIT) Caps capsule Commonly known as: DRISDOL Take 50,000 Units by mouth every Sunday.         REVIEW OF SYSTEMS: A  comprehensive ROS was conducted with the patient and is negative except as per HPI    OBJECTIVE:  VS: BP (!) 144/82 (BP Location: Right Arm, Patient Position: Sitting, Cuff Size: Large)   Pulse 85   Ht 5' (1.524 m)   Wt 201 lb 9.6 oz (91.4 kg)   SpO2 98%   BMI 39.37 kg/m    Wt Readings from Last 3 Encounters:  04/22/20 201 lb 9.6 oz (91.4 kg)  04/18/20 202 lb 9.6 oz (91.9 kg)  01/15/20 207 lb 3.2 oz (94 kg)     EXAM: General: Pt appears well and is in NAD  Eyes: External eye exam normal without stare, lid lag or exophthalmos.  EOM intact.    Neck: General: Supple without adenopathy. Thyroid: Thyroid size normal. Isthmic nodule ~ 1.5 cm   Lungs: Clear with good BS bilat with no rales, rhonchi, or wheezes  Heart: Auscultation: RRR.  Abdomen: Normoactive bowel sounds, soft, nontender, without masses or organomegaly palpable  Extremities:  BL LE: No pretibial edema normal ROM and strength.  Skin: Hair: Texture and amount normal with gender appropriate distribution Skin Inspection: No rashes Skin Palpation: Skin temperature, texture, and thickness normal to palpation  Neuro: Cranial nerves: II - XII grossly intact  Motor: Normal strength throughout  Mental Status: Judgment, insight: Intact Orientation: Oriented to time, place, and person Memory: Intact for recent and remote events Mood and affect: anxious and tearful      DATA REVIEWED: Results for PRIYANKA, CAUSEY (MRN [id number]) as of 04/25/2020 09:06  Ref. Range 04/22/2020 14:33  TSH Latest Ref Range: 0.35 - 4.50 uIU/mL 0.17 (L)  Triiodothyronine (T3) Latest Ref Range: 76 - 181 ng/dL 96  Y5,KPTW(SFKCLE) Latest Ref Range: 0.60 - 1.60 ng/dL 7.51  Results for PENNYE, BEEGHLY (MRN [id number]) as of 04/25/2020 09:06  Ref. Range 04/22/2020 14:33  TRAB Latest Ref Range: <=2.00 IU/L <1.00      03/25/2020 TSH 0.259 FT4 0.98   Thyroid Ultrasound 04/07/2020 Nodule #1 isthmic,lower central, smooth margins, hyperechoic, peripheral  calcifications, 2.5x1.5x2.6 cm  Nodule#2 Left mid central, smooth margins, heterogenous, no calcifications, 1.1x1x1.1 cm   Scattered nodules otherwise demonstrated within the right and left lobes of thyroid gland with primarily hypoechoic and heterogenous echo texture   ASSESSMENT/PLAN/RECOMMENDATIONS:   1. Subclinical Hyperthyroidism   - Pt is clinically euthyroid - No local neck symptoms - TRAB negative , most likely has autonomous thyroid nodule, will proceed with thyroid uptake and scan    2. Multinodular Goiter:  - No local neck symptoms  - Most likely pt has autonomous thyroid nodule - Will proceed with thyroid uptake and scan. If isthmic  nodule is cold will proceed with FNA - I explained to the pt during her visit if TSH is low will proceed with thyroid uptake and scan , I explained to her this is a 2 day process and she agreed to have this done in [place]     F/U in 3 months   Addendum: attempted to call the pt on 6/30 and 7/1 with no answer . A letter was sent   Signed electronically by: Lyndle Herrlich, MD  [facility]  [facility] [address] [place], [place] [address] Phone: [phone] FAX: [phone]   CC: Rema Jasmine, NP [address] [place] [address] Phone: [phone] Fax: [phone]   Return to Endocrinology clinic as below: Future Appointments  Date Time Provider Department Center  06/10/2020  8:30 AM [facility] Heron [facility] None  06/10/2020  9:30 AM [facility] TECH2 [facility] None  06/17/2020 10:00 AM [facility] Heron [facility] None

## 2020-04-23 LAB — CBC WITH DIFFERENTIAL/PLATELET
Basophils Absolute: 0.1 10*3/uL (ref 0.0–0.1)
Basophils Relative: 1.1 % (ref 0.0–3.0)
Eosinophils Absolute: 0.2 10*3/uL (ref 0.0–0.7)
Eosinophils Relative: 2.1 % (ref 0.0–5.0)
HCT: 38.8 % (ref 36.0–46.0)
Hemoglobin: 13 g/dL (ref 12.0–15.0)
Lymphocytes Relative: 32.5 % (ref 12.0–46.0)
Lymphs Abs: 2.4 10*3/uL (ref 0.7–4.0)
MCHC: 33.6 g/dL (ref 30.0–36.0)
MCV: 86.5 fl (ref 78.0–100.0)
Monocytes Absolute: 0.6 10*3/uL (ref 0.1–1.0)
Monocytes Relative: 7.7 % (ref 3.0–12.0)
Neutro Abs: 4.1 10*3/uL (ref 1.4–7.7)
Neutrophils Relative %: 56.6 % (ref 43.0–77.0)
Platelets: 266 10*3/uL (ref 150.0–400.0)
RBC: 4.49 Mil/uL (ref 3.87–5.11)
RDW: 14.6 % (ref 11.5–15.5)
WBC: 7.3 10*3/uL (ref 4.0–10.5)

## 2020-04-23 LAB — COMPREHENSIVE METABOLIC PANEL
ALT: 17 U/L (ref 0–35)
AST: 13 U/L (ref 0–37)
Albumin: 4.6 g/dL (ref 3.5–5.2)
Alkaline Phosphatase: 54 U/L (ref 39–117)
BUN: 30 mg/dL — ABNORMAL HIGH (ref 6–23)
CO2: 29 mEq/L (ref 19–32)
Calcium: 10.8 mg/dL — ABNORMAL HIGH (ref 8.4–10.5)
Chloride: 104 mEq/L (ref 96–112)
Creatinine, Ser: 0.66 mg/dL (ref 0.40–1.20)
GFR: 90.21 mL/min (ref >60.00)
Glucose, Bld: 80 mg/dL (ref 70–99)
Potassium: 4.2 mEq/L (ref 3.5–5.1)
Sodium: 141 mEq/L (ref 135–145)
Total Bilirubin: 0.4 mg/dL (ref 0.2–1.2)
Total Protein: 6.9 g/dL (ref 6.0–8.3)

## 2020-04-23 LAB — TSH: TSH: 0.17 u[IU]/mL — ABNORMAL LOW (ref 0.35–4.50)

## 2020-04-23 LAB — T4, FREE: Free T4: 0.62 ng/dL (ref 0.60–1.60)

## 2020-04-24 ENCOUNTER — Telehealth: Payer: Self-pay | Admitting: Internal Medicine

## 2020-04-24 LAB — T3: T3, Total: 96 ng/dL (ref 76–181)

## 2020-04-24 LAB — TRAB (TSH RECEPTOR BINDING ANTIBODY): TRAB: <1 IU/L (ref <=2.00)

## 2020-04-24 NOTE — Telephone Encounter (Signed)
Attempted to call the pt on 6/30 and 7/1st with no answer. A message was left for a call back     Pt will need thyroid uptake and scan     [name], MD  [facility]  [facility] [address] [place], [place] [address] Phone: [phone] FAX: [phone]

## 2020-04-25 ENCOUNTER — Encounter: Payer: Self-pay | Admitting: Internal Medicine

## 2020-05-11 ENCOUNTER — Inpatient Hospital Stay: Admission: RE | Admit: 2020-05-11 | Payer: BC Managed Care – PPO | Source: Ambulatory Visit

## 2020-05-14 ENCOUNTER — Ambulatory Visit: Payer: BC Managed Care – PPO | Admitting: Neurology

## 2020-05-14 ENCOUNTER — Other Ambulatory Visit: Payer: Self-pay

## 2020-05-14 ENCOUNTER — Encounter (HOSPITAL_COMMUNITY)
Admission: RE | Admit: 2020-05-14 | Discharge: 2020-05-14 | Disposition: A | Discharge status: Home / Self Care | Payer: BC Managed Care – PPO | Source: Ambulatory Visit | Attending: Internal Medicine | Admitting: Internal Medicine

## 2020-05-14 DIAGNOSIS — E0590 Thyrotoxicosis, unspecified without thyrotoxic crisis or storm: Secondary | ICD-10-CM | POA: Insufficient documentation

## 2020-05-14 DIAGNOSIS — E042 Nontoxic multinodular goiter: Secondary | ICD-10-CM | POA: Insufficient documentation

## 2020-05-14 MED ORDER — SODIUM IODIDE I 131 CAPSULE
4.9740 | Freq: Once | INTRAVENOUS | Status: AC | PRN
  Administered 2020-05-14: 4.974 via ORAL

## 2020-05-15 ENCOUNTER — Encounter (HOSPITAL_COMMUNITY)
Admission: RE | Admit: 2020-05-15 | Discharge: 2020-05-15 | Disposition: A | Discharge status: Home / Self Care | Payer: BC Managed Care – PPO | Source: Ambulatory Visit | Attending: Internal Medicine | Admitting: Internal Medicine

## 2020-05-15 DIAGNOSIS — E042 Nontoxic multinodular goiter: Secondary | ICD-10-CM | POA: Diagnosis not present

## 2020-05-15 MED ORDER — SODIUM IODIDE I 131 CAPSULE
4.9500 | Freq: Once | INTRAVENOUS | Status: AC | PRN
  Administered 2020-05-14: 4.95 via ORAL

## 2020-05-15 MED ORDER — SODIUM PERTECHNETATE TC 99M INJECTION
5.2400 | Freq: Once | INTRAVENOUS | Status: AC | PRN
  Administered 2020-05-15: 5.24 via INTRAVENOUS

## 2020-05-20 ENCOUNTER — Telehealth: Payer: Self-pay | Admitting: Family

## 2020-05-20 DIAGNOSIS — E042 Nontoxic multinodular goiter: Secondary | ICD-10-CM

## 2020-05-20 NOTE — Telephone Encounter (Signed)
Caller: Charese Call back # [phone]  Would like lab results

## 2020-05-20 NOTE — Telephone Encounter (Signed)
Please advise 

## 2020-05-21 MED ORDER — METHIMAZOLE 5 MG PO TABS: 5.0000 mg | ORAL_TABLET | Freq: Every day | ORAL | 1 refills | Status: DC

## 2020-05-21 NOTE — Telephone Encounter (Signed)
Spoke to the pt on 7/28 at 8:30 about uptake and scan and the need to start thionamide therapy as well as proceeding with FNA of the isthmic nodule.   Pt in agreement. FNA will be ordered at [place], [place]     [name], MD  [facility] End[facility]  [facility] [address] [place], [place] [address] Phone: [phone] FAX: [phone]

## 2020-05-27 ENCOUNTER — Telehealth: Payer: Self-pay | Admitting: Internal Medicine

## 2020-05-27 NOTE — Telephone Encounter (Signed)
Sabrina Mcdonald with [name] ph# [phone] called re: Patient has been scheduled for biopsy on 05/28/2020.

## 2020-05-27 NOTE — Telephone Encounter (Signed)
Fyi.

## 2020-05-30 ENCOUNTER — Ambulatory Visit: Payer: BC Managed Care – PPO | Admitting: Internal Medicine

## 2020-05-30 ENCOUNTER — Encounter: Payer: Self-pay | Admitting: Internal Medicine

## 2020-05-30 ENCOUNTER — Other Ambulatory Visit: Payer: Self-pay

## 2020-05-30 VITALS — BP 136/78 | HR 94 | Ht 60.0 in | Wt 202.0 lb

## 2020-05-30 DIAGNOSIS — E042 Nontoxic multinodular goiter: Secondary | ICD-10-CM

## 2020-05-30 DIAGNOSIS — E0590 Thyrotoxicosis, unspecified without thyrotoxic crisis or storm: Secondary | ICD-10-CM | POA: Diagnosis not present

## 2020-05-30 LAB — T4, FREE: Free T4: 0.73 ng/dL (ref 0.60–1.60)

## 2020-05-30 LAB — TSH: TSH: 0.17 u[IU]/mL — ABNORMAL LOW (ref 0.35–4.50)

## 2020-05-30 NOTE — Progress Notes (Signed)
Name: Sabrina Mcdonald  MRN/ DOB: [id number], [birth year]    Age/ Sex: 64 y.o., female     PCP: Rema Jasmine, NP   Reason for Endocrinology Evaluation: MNG     Initial Endocrinology Clinic Visit: 04/22/2020    PATIENT IDENTIFIER: Sabrina Mcdonald is a 64 y.o., female with a past medical history of bronchiectasis, Asthma   and depression . She has followed with [facility] since 04/22/2020 for consultative assistance with management of her MNG.   HISTORICAL SUMMARY:  Pt presented to her PCP with c/o fatigue, brain fogginess, and  feeling achy , was found to have a low TSH at 0.259 uIU/Ml in 03/2020 . This prompted an ultrasound showing MNG with the largest being a left mid central 1.1x1x1.1 cm nodule.   Uptake and scan showed MNG with a 24-hr uptake of I-131 uptake = 16.9% (normal 10-30%)  Since the uptake was not elevated we opted to treat withi methimazole due to multiple nonspecific symptoms .Which was started in 04/2020     Maternal family history with goiter  SUBJECTIVE:    Today (05/31/2020):  Sabrina Mcdonald is here for a follow up    She is S/P left thyroid nodule  On 8/3   Weight has been stable  She is on prednisone for lung disease since 06/2018  She continues with fogginess, occasional dizziness         ROS:  As per HPI.   HISTORY:  Past Medical History:  Past Medical History:  Diagnosis Date  . Abnormal EKG   . Anxiety   . Fatigue   . Heat intolerance   . Jaw pain   . Migraine   . Renal disorder    Past Surgical History:  Past Surgical History:  Procedure Laterality Date  . APPENDECTOMY    . BRAIN SURGERY    . CESAREAN SECTION    . COLONOSCOPY WITH PROPOFOL N/A 04/14/2020   Procedure: COLONOSCOPY WITH PROPOFOL;  Surgeon: Corbin Ade, MD;  Location: [facility];  Service: Endoscopy;  Laterality: N/A;  7:30am  . kidnsey stones     . laproscopy      Social History:  reports that she has never smoked. She has never used  smokeless tobacco. She reports that she does not drink alcohol and does not use drugs. Family History:  Family History  Problem Relation Age of Onset  . Colon cancer Neg Hx      HOME MEDICATIONS: Allergies as of 05/30/2020      Reactions   Phenobarbital Itching, Rash      Medication List       Accurate as of May 30, 2020 11:59 PM. If you have any questions, ask your nurse or doctor.        acetaminophen 325 MG tablet Commonly known as: TYLENOL Take 325 mg by mouth daily.   albuterol 108 (90 Base) MCG/ACT inhaler Commonly known as: VENTOLIN HFA Inhale 1-2 puffs into the lungs every 6 (six) hours as needed for wheezing or shortness of breath.   aspirin EC 81 MG tablet Take 81 mg by mouth daily. Swallow whole.   Brovana 15 MCG/2ML Nebu Generic drug: arformoterol Take 15 mcg by nebulization daily.   buPROPion 150 MG 24 hr tablet Commonly known as: WELLBUTRIN XL Take 150 mg by mouth daily.   busPIRone 5 MG tablet Commonly known as: BUSPAR Take 5 mg by mouth 2 (two) times daily as needed for anxiety.   clarithromycin 500  MG tablet Commonly known as: BIAXIN Take 500 mg by mouth See admin instructions. Take 1 tablet (500 mg) by mouth twice daily on Mondays, Wednesdays, & Fridays.   Dupixent 300 MG/2ML prefilled syringe Generic drug: dupilumab Inject 300 mg into the skin every 14 (fourteen) days.   escitalopram 20 MG tablet Commonly known as: LEXAPRO Take 20 mg by mouth at bedtime.   fluticasone 50 MCG/ACT nasal spray Commonly known as: FLONASE Place 1 spray into both nostrils daily.   hydrOXYzine 25 MG tablet Commonly known as: ATARAX/VISTARIL Take 25 mg by mouth 3 (three) times daily as needed for anxiety.   ibuprofen 200 MG tablet Commonly known as: ADVIL Take 200 mg by mouth daily.   Klor-Con M20 20 MEQ tablet Generic drug: potassium chloride SA Take 20 mEq by mouth daily.   loratadine 10 MG tablet Commonly known as: CLARITIN Take 20 mg by mouth at  bedtime.   losartan-hydrochlorothiazide 100-25 MG tablet Commonly known as: HYZAAR Take 1 tablet by mouth daily.   methimazole 5 MG tablet Commonly known as: TAPAZOLE Take 1 tablet (5 mg total) by mouth daily.   montelukast 10 MG tablet Commonly known as: SINGULAIR Take 10 mg by mouth at bedtime.   predniSONE 5 MG tablet Commonly known as: DELTASONE Take 5 mg by mouth daily with breakfast.   Vitamin D (Ergocalciferol) 1.25 MG (50000 UNIT) Caps capsule Commonly known as: DRISDOL Take 50,000 Units by mouth every Sunday.         OBJECTIVE:   PHYSICAL EXAM: VS: BP 136/78   Pulse 94   Ht 5' (1.524 m)   Wt 202 lb (91.6 kg)   SpO2 98%   BMI 39.45 kg/m    EXAM: General: Pt appears well and is in NAD  Neck: General: Supple without adenopathy. Thyroid: Thyroid size normal.  No goiter or nodules appreciated. No thyroid bruit.  Lungs: Clear with good BS bilat with no rales, rhonchi, or wheezes  Heart: Auscultation: RRR.  Abdomen: Normoactive bowel sounds, soft, nontender, without masses or organomegaly palpable  Extremities:  BL LE: No pretibial edema normal ROM and strength.  Mental Status: Judgment, insight: Intact Orientation: Oriented to time, place, and person Mood and affect: No depression, anxiety, or agitation     DATA REVIEWED: Results for Sabrina Mcdonald (MRN [id number]) as of 05/31/2020 08:24  Ref. Range 05/30/2020 13:50  TSH Latest Ref Range: 0.35 - 4.50 uIU/mL 0.17 (L)  T4,Free(Direct) Latest Ref Range: 0.60 - 1.60 ng/dL 3.87   Results for Mcdonald, GOOLD (MRN [id number]) as of 05/30/2020 12:56  Ref. Range 04/22/2020 14:33  TRAB Latest Ref Range: <=2.00 IU/L <1.00   Thyroid Ultrasound 04/07/2020 Nodule #1 isthmic,lower central, smooth margins, hyperechoic, peripheral calcifications, 2.5x1.5x2.6 cm  Nodule#2 Left mid central, smooth margins, heterogenous, no calcifications, 1.1x1x1.1 cm   Scattered nodules otherwise demonstrated within the right and left  lobes of thyroid gland with primarily hypoechoic and heterogenous echo texture      Thyroid uptake and scan 05/15/2020  Foci of increased and decreased uptake in both thyroid lobes consistent with multinodular thyroid gland.  No dominant mass.  4 hour I-131 uptake = 6.6% (normal 5-20%)  24 hour I-131 uptake = 16.9% (normal 10-30%)  IMPRESSION: Multinodular thyroid gland.  Normal 4 hour and 24 hour radio iodine uptakes.   ASSESSMENT / PLAN / RECOMMENDATIONS:   1. Subclinical Hyperthyroidism   - Pt with multiple non-specific symptoms  - No local neck symptoms - This is caused by autonomous  MNG - She just started methimazole last week, repeat TFT's stable  But this is not unexpected as it will take longer for TFT's to adjust.    2. Multinodular Goiter:  - No local neck symptoms  - She is S/P FNA of the left thyroid nodule - Awaiting results     F/U in 3 months    Signed electronically by: Lyndle Herrlich, MD  [facility]  [facility] [address] [place] [place], [place] [address] Phone: [phone] FAX: [phone]      CC: Rema Jasmine, NP [address] [place] [address] Phone: [phone]  Fax: [phone]   Return to Endocrinology clinic as below: Future Appointments  Date Time Provider Department Center  06/10/2020  8:30 AM Clayborn Heron [facility] None  06/10/2020  9:30 AM [facility] NEUROPSYCH TECH2 [facility] None  06/17/2020 10:00 AM Clayborn Heron [facility] None  08/29/2020  7:50 AM Shamleffer, Konrad Dolores, MD [facility] None

## 2020-05-30 NOTE — Patient Instructions (Signed)
-   Methimazole 5 mg, 1 tablet daily

## 2020-06-04 ENCOUNTER — Telehealth: Payer: Self-pay | Admitting: Internal Medicine

## 2020-06-04 NOTE — Telephone Encounter (Signed)
Patient called to get results from biopsy.  She disconnected call before able to advise of anything.

## 2020-06-04 NOTE — Telephone Encounter (Signed)
Patient requests to be called at ph# [phone] for her biopsy results

## 2020-06-04 NOTE — Telephone Encounter (Signed)
Called pt and she explained her situation to me. After looking for pt's biopsy results from [facility], it does not appear to have been received as of this time. Pt was informed of this, and she will contact [facility] tomorrow and see if these results can be faxed to this office.

## 2020-06-04 NOTE — Telephone Encounter (Signed)
N/A Patient disconnected

## 2020-06-05 NOTE — Telephone Encounter (Signed)
Pathology results have been received. Unfortunately, we do not have an available provider in the office to review these results. Will need to await Dr. Harvel Ricks return so she can review and address accordingly. Results have been labeled and placed on Dr. Harvel Ricks desk for her review. Pt will receive a returned call once addressed as documented above.

## 2020-06-09 ENCOUNTER — Telehealth: Payer: Self-pay | Admitting: Internal Medicine

## 2020-06-09 NOTE — Telephone Encounter (Addendum)
Discussed benign fNA results of the right isthmic nodule on 06/09/2020 at 1715      [name], MD  [facility]  [facility] [address] [place], [place] [address] Phone: [phone] FAX: [phone]

## 2020-06-09 NOTE — Telephone Encounter (Signed)
-----   Message from Orland Penman, MD sent at 05/31/2020  8:21 AM EDT ----- Obtain FNA results from [facility]

## 2020-06-10 ENCOUNTER — Ambulatory Visit: Payer: BC Managed Care – PPO | Admitting: Internal Medicine

## 2020-06-10 ENCOUNTER — Encounter: Payer: BC Managed Care – PPO | Admitting: Counselor

## 2020-06-17 ENCOUNTER — Encounter: Payer: BC Managed Care – PPO | Admitting: Counselor

## 2020-07-24 ENCOUNTER — Ambulatory Visit (INDEPENDENT_AMBULATORY_CARE_PROVIDER_SITE_OTHER): Payer: BC Managed Care – PPO | Admitting: Counselor

## 2020-07-24 ENCOUNTER — Encounter: Payer: Self-pay | Admitting: Counselor

## 2020-07-24 ENCOUNTER — Ambulatory Visit: Payer: BC Managed Care – PPO

## 2020-07-24 ENCOUNTER — Other Ambulatory Visit: Payer: Self-pay

## 2020-07-24 DIAGNOSIS — E876 Hypokalemia: Secondary | ICD-10-CM

## 2020-07-24 DIAGNOSIS — E0590 Thyrotoxicosis, unspecified without thyrotoxic crisis or storm: Secondary | ICD-10-CM

## 2020-07-24 DIAGNOSIS — F09 Unspecified mental disorder due to known physiological condition: Secondary | ICD-10-CM | POA: Diagnosis not present

## 2020-07-24 NOTE — Progress Notes (Signed)
Psychometrist Note   Cognitive testing was administered to Sabrina Mcdonald by Kimberly Shuttleworth, B.S. (Technician) under the supervision of Peter Stewart, Psy.D., ABN. [name] was able to tolerate all test procedures. Dr. Stewart met with the patient as needed to manage any emotional reactions to the testing procedures. Rest breaks were offered.    The battery of tests administered was selected by Dr. Stewart with consideration to the patient's current level of functioning, the nature of her symptoms, emotional and behavioral responses during the interview, level of literacy, observed level of motivation/effort, and the nature of the referral question. This battery was communicated to the psychometrist. Communication between Dr. Stewart and the psychometrist was ongoing throughout the evaluation and Dr. Stewart was immediately accessible at all times. Dr. Stewart provided supervision to the technician on the date of this service, to the extent necessary to assure the quality of all services provided.    [name] will return in approximately one week for an interactive feedback session with Dr. Stewart, at which time test performance, clinical impressions, and treatment recommendations will be reviewed in detail. The patient understands she can contact our office should she require our assistance before this time.   A total of 90 minutes of billable time were spent with Sabrina Mcdonald by the technician, including test administration and scoring time. Billing for these services is reflected in Dr. Stewart's note.   This note reflects time spent with the psychometrician and does not include test scores, clinical history, or any interpretations made by Dr. Stewart. The full report will follow in a separate note. 
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
## 2020-07-25 NOTE — Progress Notes (Signed)
NEUROPSYCHOLOGICAL TEST SCORES [facility]  Patient Name: VONDRA ALDREDGE MRN: [id number] Date of Birth: [birth year] Age: 64 y.o. Education: 12 years  Measurement properties of test scores: IQ, Index, and Standard Scores (SS): Mean = 100; Standard Deviation = 15 Scaled Scores (Ss): Mean = 10; Standard Deviation = 3 Z scores (Z): Mean = 0; Standard Deviation = 1 T scores (T); Mean = 50; Standard Deviation = 10  TEST SCORES:    Note: This summary of test scores accompanies the interpretive report and should not be interpreted by unqualified individuals or in isolation without reference to the report. Test scores are relative to age, gender, and educational history as available and appropriate.   Performance Validity        ACS: Raw Descriptor      Word Choice: 47 Within Expectation      The Dot Counting Test: Raw Descriptor      E-Score 11 Within Expectation      Embedded Measures: Raw Descriptor      RBANS Effort Index: 0 Within Expectation      WAIS-IV Reliable Digit Span 8 Within Expectation      WAIS-IV Reliable Digit Span Revised 12 Within Expectation      Expected Functioning        Wide Range Achievement Test (Word Reading): Standard/Scaled Score Percentile       Word Reading 88 21      Cognitive Testing        RBANS, Form : Standard/Scaled Score Percentile  Total Score 83 13  Immediate Memory 85 16      List Learning 4 2      Story Memory 11 63  Visuospatial/Constructional 81 10      Figure Copy   (15) 5 5      Judgment of Line Orientation   (16) --- 26-50  Language 90 25      Picture Naming --- 3-9      Semantic Fluency 10 50  Attention 100 50      Digit Span 10 50      Coding 10 50  Delayed Memory 81 10      List Recall   (1) --- 3-9      List Recognition   (18) --- 10-16      Story Recall   (10) 11 63      Figure Recall   (10) 8 25      Wechsler Adult Intelligence Scale - IV: Standard/Scaled Score Percentile  Working Memory Index 100 50       Digit Span 8 25          Digit Span Forward 9 37          Digit Span Backward 7 16          Digit Span Sequencing 9 37      Arithmetic 12 75  Processing Speed Index 100 50      Symbol Search 9 37      Coding 11 63      Neuropsychological Assessment Battery (Language Module): T-score Percentile      Naming   (28) 39 14      Verbal Fluency: T-score Percentile      Controlled Oral Word Association (F-A-S) 42 21      Semantic Fluency (Animals) 42 21      Trail Making Test: T-Score Percentile      Part A 56 73      Part B  71 98      Modified [place] Card Sorting Test (MWCST): Standard/T-Score Percentile      Number of Categories Correct 19 <1      Number of Perseverative Errors 30 2      Number of Total Errors 22 <1      Percent Perseverative Errors 45 31  Executive Function Composite 58 <1      Boston Diagnostic Aphasia Exam: Raw Score Scaled Score      Complex Ideational Material 9 5      Clock Drawing Raw Score Descriptor      Command 10 WNL      Rating Scales         Raw Score Descriptor  Patient Health Questionnaire - 9 10 Moderate  GAD-7 5 Mild   Janashia Parco V. Roseanne Reno PsyD, ABN Clinical Neuropsychologist

## 2020-07-29 NOTE — Progress Notes (Signed)
NEUROPSYCHOLOGICAL EVALUATION [facility]  Patient Name: Sabrina Mcdonald MRN: [id number] Date of Birth: [birth year] Age: 64 y.o. Education: 12 years  Clinical Impressions  Sabrina Mcdonald is a 64 y.o., right-hand dominant, married woman with a history of bronchiectasis, athsma, hyperthyroidism, multinodular goiter, depression, anxiety, PTSD, recent appendectomy for acute appendicitis, and colloid cyst s/p resection in 1996. Since her laparoscopic surgery for her appenicitis, she has not "felt right." She was found to have hypokalemia (which has not remitted entirely) and hypomagnesemia around that time. She has had one health issue after the other as of late and admits that she is under a great deal of stress, cries frequently when she is not otherwise occupied, and has felt anxious for a long time.   On neuropsychological evaluation, Sabrina Mcdonald demonstrated normal range scores in many areas, with the exception of extremely low scores on the Modified [employer], a measure of executive function and on immediate and delayed recall for unstructured verbal information. Her memory problems are most likely executively mediated given good learning and recall for structured verbal information, visual information, and better delayed recognition than free recall performance. She screened in the moderate range for depressive symptoms and in the mild yet clinically significant range for anxiety symptoms. That may be an underestimate of her true anxiety, as she admits "I don't know what it's like not to be anxious."  Sabrina Mcdonald's performance profile is thus consistent with benign executive control issues that are most likely on the basis of her anxiety and depression, any attendant metabolic abnormalities, and other contributions from her multiple health problems (e.g., thyroid problems, fatigue/distraction related to her breathing condition, etc). She may also have some subtle sequelae  from her colloid cyst s/p resection, although that doesn't go with the timeline of reported changes. The profile is not concerning for an underlying degenerative condition and she will be reassured. My sense is that her issues will improve once she gets better control of her mental health and other physical health issues.    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 I have a high level of confidence in this interpretation because the findings were very consistent. So, while you do have some (potentially reversible) mild cognitive problems, I am not concerned that they are going to get worse and instead think it is likely they will get better with resolution of underlying contributors.   Executive control is a higher order cognitive ability involved in regulating other cognitive resources. Much like the conductor of an orchestra coordinates multiple instruments to make music, executive capacities coordinate other lower-order skills (e.g., movement, language, attention) to form complex human behaviors. Individuals with executive control problems are often capable of doing most of the things they did before they were having problems, but they may not do so as effortlessly, efficiently, and consistently. These difficulties often manifest as problems tracking information, multitasking, and paying attention. Executive control problems often result in cognitive inefficiency and can present as "memory problems," because they decrease encoding and spontaneous retrieval of information.   In your case, I think that your executive control problems are most  likely due to depression and anxiety, of which you appear to be quite symptomatic. We  must also consider the ongoing impact of your medical conditions (e.g., any remaining low potassium, magnesium, or thyroid issues). It is possible that you also have some subtle sequelae from your colloid cyst and neurosurgery that we are picking up on testing but I realize that doesn't match with the timeline you notice in terms of recent changes so I think the other factors are probably more important.   Depression can affect cognitive functioning in several ways. For one, there are neurobiological changes in depression that can contribute to attention, concentration, and memory problems. These changes are so common they are part of the criteria we use to diagnose depressive disorders. Depression can also negatively impact your own appraisal of your cognitive abilities, leading you to feel like you are performing more poorly than is objectively warranted. The recommended treatments for depression and anxiety are psychiatric medications, which you are already taking, and psychotherapy. Particularly if there is a trauma component, I think psychotherapy is important. Because you are already involved in treatment but do not feel you are benefitting, I would encourage you to discuss your concerns or desired benefit that you are not getting with your treatment provider.   You have significant anxiety, which can negatively affect cognition and result in overfocus on cognitive performance. I would encourage you to actively manage your anxiety through mindfulness meditation, bibliotherapy (e.g., self-help books), pleasant activity scheduling, and the like.   Avoid overfocusing on cognitive performance. Memory and cognition are notoriously fallible and if you are looking for cognitive problems, you are bound to find them. Once someone gets worried about their memory and thinking, they may overfocus on how they are doing day-to-day,  and then when normal day-to-day cognitive errors are made, this becomes a cause for more concern. This concern and anxiety then decreases focus from the task at hand, reducing concentration, causing more cognitive problems, and creating a vicious cycle. Rather than critiquing your performance, I would encourage you to remain present minded and focus on the task at hand. Perhaps most importantly, have reasonable expectations for yourself.  Healthy people forget things, lose focus, and do not perform 100% correctly all the time. Some cognitive errors are normal and are not necessarily a sign that there is something wrong with your brain.   The following compensatory strategies may be helpful for managing day-to-day memory symptoms: . Minimize distractions and interruptions to the extent possible. Be an active observer, present-minded, and focus attention. Focus on only one task for a period of time.  . Get organized. Establish routines and stick to them. Make and use checklists.  . Use external memory aids as needed, such as a planner and notebook. Repetition, written reminders, and keeping a calendar of appointments may be helpful. Charlene Brooke a place to keep your keys, wallet, cell phone, and other personal belongings.  . Break down tasks into smaller steps to help get started and to keep from feeling overwhelmed.  . Increase your success learning information by breaking it into manageable chunks, connecting it to previously learned information, or forming associations with what you are trying to remember.   Test Findings  Test scores are summarized in additional documentation associated with this encounter. Test scores are relative to age, gender, and educational history as available and appropriate. There were no concerns about performance validity as all findings fell within normal expectations.   General Intellectual Functioning/Achievement:  Performance on single word reading was low average,  which presents as a reasonable standard of comparison for  this patient's cognitive test performance.   Attention and Processing Efficiency: Performance on indicators of attention was within the expected range for Sabrina Mcdonald with average performance on the overall WAIS-IV Working Memory Index. Good average range scores were found on measures of digit repetition forward and digit resequencing in ascending order. Digit repetition backward was low average. Performance when solving mental arithmetic problems without paper and pencil was also average.   With respect to processing speed, Sabrina Mcdonald performed in the average range. Average scores were seen on two different indicators of timed number-symbol coding. Efficient visual scanning and efficient visual matching were average on a symbol matching to sample test. Simple numeric sequencing was average.   Language: Indicators of processing speed revealed good average range visual object confrontation naming. Generation of words was low average in response to both semantic and phonemic prompts.   Visuospatial Function: Performance within this domain was low average, presenting at a reasonable level for Sabrina Mcdonald. She did demonstrate an unusually low score on figure copy, but inspection shows it to be largely due to omitting an item and likely rushing through the task given careless errors that she then noticed later and corrected. Judgment of angular line orientations was average.   Learning and Memory: Performance on measures of learning and memory showed lower encoding and delayed recall for a word list as compared with a short story and better recognition than delayed free recall on word list delayed trials. This suggests that executive as opposed to memory storage type problems are responsible for her low scores.   In the verbal realm, immediate recall was extremely low for a 10-item word list across 4 learning trials whereas memory for a short story  was average. Following a standard delay, Sabrina Mcdonald had poor delayed free recall for the word list but she achieved a low average score with recognition cuing. Memory for the short story was average.   Delayed recall for a modestly complex figure was average after an approximately 10 minute delay.   Executive Functions: Performance on measures of executive abilities was mixed with an overall impression of some problems given her Modified [employer] scores and memory profile. Modified [employer] performance was  extremely low level on the [employer] Composite with low scores for both categories completed and perseverative errors. She did better on alternating sequencing of numbers and letters of the alphabet, which was in the very superior range. Generation of words in response to the letters F-A-S was low average. Clock drawing was normal and reasoning with complex verbal information was average.    Rating Scale(s): Sabrina Mcdonald screened at a moderate level for depressive symptoms and in the mild range for anxiety sympotms, but as above, her lifeling experience of anxiety   Bettye Boeck. Roseanne Reno PsyD, ABN Clinical Neuropsychologist

## 2020-07-31 ENCOUNTER — Ambulatory Visit (INDEPENDENT_AMBULATORY_CARE_PROVIDER_SITE_OTHER): Payer: BC Managed Care – PPO | Admitting: Counselor

## 2020-07-31 ENCOUNTER — Other Ambulatory Visit: Payer: Self-pay

## 2020-07-31 ENCOUNTER — Encounter: Payer: Self-pay | Admitting: Counselor

## 2020-07-31 DIAGNOSIS — F418 Other specified anxiety disorders: Secondary | ICD-10-CM

## 2020-07-31 DIAGNOSIS — F09 Unspecified mental disorder due to known physiological condition: Secondary | ICD-10-CM | POA: Diagnosis not present

## 2020-07-31 NOTE — Progress Notes (Signed)
NEUROPSYCHOLOGY TELEMEDICINE NOTE [facility]  Telemedicine statement:  I discussed the limitations of neuropsychological care via telemedicine and the availability of in person appointments. The patient expressed understanding and agreed to proceed. The patient was verified with two identifiers.  The visit modality was: telephonic The patient location was: home The provider location was: office  The following individuals participated: Sabrina Mcdonald  Feedback Note: I met with Sabrina Mcdonald to review the findings resulting from Sabrina Mcdonald neuropsychological evaluation. Since the last appointment, Sabrina Mcdonald continues to struggle with health issues. Sabrina Mcdonald simply doesn't feel as robust as Sabrina Mcdonald did, and has a sensation that "something isn't right." Sabrina Mcdonald is dealing with numerous ongoing health issues including bronchiectasis, thyroid problems, and persistent hypokalemia. Time was spent reviewing the impressions and recommendations that are detailed in the evaluation report. I provided my strong reassurance that Sabrina Mcdonald cognitive problems are mostly likely related to reversible causes, I do not see Alzheimer's disease or another neurodegenerative condition in Sabrina Mcdonald presentation or performance. I discussed with Sabrina Mcdonald at length the importance of not overfocusing, being kind to herself, and optimizing Sabrina Mcdonald general health behaviors including , as reflected in the patient instructions. I took time to explain the findings and answer all the patient's questions. I encouraged Sabrina Mcdonald to contact me should Sabrina Mcdonald have any further questions or if further follow up is desired.   Current Medications and Medical History   Current Outpatient Medications  Medication Sig Dispense Refill  . acetaminophen (TYLENOL) 325 MG tablet Take 325 mg by mouth daily.    Marland Kitchen albuterol (VENTOLIN HFA) 108 (90 Base) MCG/ACT inhaler Inhale 1-2 puffs into the lungs every 6 (six) hours as needed for wheezing or shortness of breath.    Marland Kitchen aspirin EC 81  MG tablet Take 81 mg by mouth daily. Swallow whole.    Marland Kitchen BROVANA 15 MCG/2ML NEBU Take 15 mcg by nebulization daily.    Marland Kitchen buPROPion (WELLBUTRIN XL) 150 MG 24 hr tablet Take 150 mg by mouth daily.    . busPIRone (BUSPAR) 5 MG tablet Take 5 mg by mouth 2 (two) times daily as needed for anxiety.    . clarithromycin (BIAXIN) 500 MG tablet Take 500 mg by mouth See admin instructions. Take 1 tablet (500 mg) by mouth twice daily on Mondays, Wednesdays, & Fridays.    . DUPIXENT 300 MG/2ML prefilled syringe Inject 300 mg into the skin every 14 (fourteen) days.    Marland Kitchen escitalopram (LEXAPRO) 20 MG tablet Take 20 mg by mouth at bedtime.    . fluticasone (FLONASE) 50 MCG/ACT nasal spray Place 1 spray into both nostrils daily.     . hydrOXYzine (ATARAX/VISTARIL) 25 MG tablet Take 25 mg by mouth 3 (three) times daily as needed for anxiety.    Marland Kitchen ibuprofen (ADVIL) 200 MG tablet Take 200 mg by mouth daily.    Marland Kitchen KLOR-CON M20 20 MEQ tablet Take 20 mEq by mouth daily.    Marland Kitchen loratadine (CLARITIN) 10 MG tablet Take 20 mg by mouth at bedtime.    Marland Kitchen losartan-hydrochlorothiazide (HYZAAR) 100-25 MG per tablet Take 1 tablet by mouth daily. 90 tablet 0  . methimazole (TAPAZOLE) 5 MG tablet Take 1 tablet (5 mg total) by mouth daily. 90 tablet 1  . montelukast (SINGULAIR) 10 MG tablet Take 10 mg by mouth at bedtime.    . predniSONE (DELTASONE) 5 MG tablet Take 5 mg by mouth daily with breakfast.     . Vitamin D, Ergocalciferol, (DRISDOL) 1.25 MG (50000 UNIT)  CAPS capsule Take 50,000 Units by mouth every Sunday.     No current facility-administered medications for this visit.    Patient Active Problem List   Diagnosis Date Noted  . Hyperthyroidism 04/22/2020  . Multinodular goiter 04/22/2020  . Constipation 01/15/2020  . History of colonic polyps 01/15/2020  . Migraine   . Anxiety   . Fatigue   . Jaw pain   . Abnormal EKG   . Heat intolerance     Mental Status and Behavioral Observations  Sabrina Mcdonald was  available at the prespecified time for this telephonic appointment and was alert and generally oriented (orientation not formally assessed). Speech was normal in rate, rhythm, volume, and prosody. Self-reported mood was "hanging in there" and affect as assessed by vocal quality was anxious. Thought process was logical and goal oriented and thought content was appropriate to the topics discussed. There were no safety concerns identified at today's encounter, such as thoughts of harming self or others.   Plan  Feedback provided regarding the patient's neuropsychological evaluation. Sabrina Mcdonald has a number of underlying medical issues, which are likely throwing Sabrina Mcdonald off in addition to depression and anxiety. We discussed Sabrina Mcdonald psychotherapeutic treatment and that Sabrina Mcdonald needs to discuss Sabrina Mcdonald desired treatment progress with Sabrina Mcdonald provider. Sabrina Mcdonald presented as reassured by the conversation. Sabrina Mcdonald was encouraged to contact me if any questions arise or if further follow up is desired.   [name], PsyD, ABN Clinical Neuropsychologist  Service(s) Provided at This Encounter: 29 minutes [phone]; Psychotherapy with patient/family)

## 2020-07-31 NOTE — Patient Instructions (Signed)
multif

## 2020-08-27 ENCOUNTER — Other Ambulatory Visit: Payer: Self-pay

## 2020-08-29 ENCOUNTER — Ambulatory Visit: Payer: BC Managed Care – PPO | Admitting: Internal Medicine

## 2020-08-29 NOTE — Progress Notes (Deleted)
Name: Sabrina Mcdonald  MRN/ DOB: [id number], January 05, 1956    Age/ Sex: 64 y.o., female     PCP: Rema Jasmine, NP   Reason for Endocrinology Evaluation: MNG     Initial Endocrinology Clinic Visit: 04/22/2020    PATIENT IDENTIFIER: Sabrina Mcdonald is a 64 y.o., female with a past medical history of bronchiectasis, Asthma   and depression . She has followed with [facility] since 04/22/2020 for consultative assistance with management of her MNG.   HISTORICAL SUMMARY:  Pt presented to her PCP with c/o fatigue, brain fogginess, and  feeling achy , was found to have a low TSH at 0.259 uIU/Ml in 03/2020 . This prompted an ultrasound showing MNG with the largest being a left mid central 1.1x1x1.1 cm nodule.   Uptake and scan showed MNG with a 24-hr uptake of I-131 uptake = 16.9% (normal 10-30%)  Since the uptake was not elevated we opted to treat with methimazole due to multiple nonspecific symptoms .Which was started in 04/2020  She is S/P benign FNA  of the right isthmic nodule on 06/09/2020    Maternal family history with goiter  SUBJECTIVE:    Today (08/29/2020):  Sabrina Mcdonald is here for a follow up on subclinical hyperthyroidism and MNG  Weight has been stable  She is on prednisone for lung disease since 06/2018  She continues with fogginess, occasional dizziness     HISTORY:  Past Medical History:  Past Medical History:  Diagnosis Date  . Abnormal EKG   . Anxiety   . Fatigue   . Heat intolerance   . Jaw pain   . Migraine   . Renal disorder    Past Surgical History:  Past Surgical History:  Procedure Laterality Date  . APPENDECTOMY    . BRAIN SURGERY    . CESAREAN SECTION    . COLONOSCOPY WITH PROPOFOL N/A 04/14/2020   Procedure: COLONOSCOPY WITH PROPOFOL;  Surgeon: Corbin Ade, MD;  [facility];  Service: Endoscopy;  Laterality: N/A;  7:30am  . kidnsey stones     . laproscopy      Social History:  reports that she has never smoked.  She has never used smokeless tobacco. She reports that she does not drink alcohol and does not use drugs. Family History:  Family History  Problem Relation Age of Onset  . Colon cancer Neg Hx      HOME MEDICATIONS: Allergies as of 08/29/2020      Reactions   Phenobarbital Itching, Rash      Medication List       Accurate as of August 29, 2020  7:15 AM. If you have any questions, ask your nurse or doctor.        acetaminophen 325 MG tablet Commonly known as: TYLENOL Take 325 mg by mouth daily.   albuterol 108 (90 Base) MCG/ACT inhaler Commonly known as: VENTOLIN HFA Inhale 1-2 puffs into the lungs every 6 (six) hours as needed for wheezing or shortness of breath.   aspirin EC 81 MG tablet Take 81 mg by mouth daily. Swallow whole.   Brovana 15 MCG/2ML Nebu Generic drug: arformoterol Take 15 mcg by nebulization daily.   buPROPion 150 MG 24 hr tablet Commonly known as: WELLBUTRIN XL Take 150 mg by mouth daily.   busPIRone 5 MG tablet Commonly known as: BUSPAR Take 5 mg by mouth 2 (two) times daily as needed for anxiety.   clarithromycin 500 MG tablet Commonly known as:  BIAXIN Take 500 mg by mouth See admin instructions. Take 1 tablet (500 mg) by mouth twice daily on Mondays, Wednesdays, & Fridays.   Dupixent 300 MG/2ML prefilled syringe Generic drug: dupilumab Inject 300 mg into the skin every 14 (fourteen) days.   escitalopram 20 MG tablet Commonly known as: LEXAPRO Take 20 mg by mouth at bedtime.   fluticasone 50 MCG/ACT nasal spray Commonly known as: FLONASE Place 1 spray into both nostrils daily.   hydrOXYzine 25 MG tablet Commonly known as: ATARAX/VISTARIL Take 25 mg by mouth 3 (three) times daily as needed for anxiety.   ibuprofen 200 MG tablet Commonly known as: ADVIL Take 200 mg by mouth daily.   Klor-Con M20 20 MEQ tablet Generic drug: potassium chloride SA Take 20 mEq by mouth daily.   loratadine 10 MG tablet Commonly known as:  CLARITIN Take 20 mg by mouth at bedtime.   losartan-hydrochlorothiazide 100-25 MG tablet Commonly known as: HYZAAR Take 1 tablet by mouth daily.   methimazole 5 MG tablet Commonly known as: TAPAZOLE Take 1 tablet (5 mg total) by mouth daily.   montelukast 10 MG tablet Commonly known as: SINGULAIR Take 10 mg by mouth at bedtime.   predniSONE 5 MG tablet Commonly known as: DELTASONE Take 5 mg by mouth daily with breakfast.   Vitamin D (Ergocalciferol) 1.25 MG (50000 UNIT) Caps capsule Commonly known as: DRISDOL Take 50,000 Units by mouth every Sunday.         OBJECTIVE:   PHYSICAL EXAM: VS: There were no vitals taken for this visit.   EXAM: General: Pt appears well and is in NAD  Neck: General: Supple without adenopathy. Thyroid: Thyroid size normal.  No goiter or nodules appreciated. No thyroid bruit.  Lungs: Clear with good BS bilat with no rales, rhonchi, or wheezes  Heart: Auscultation: RRR.  Abdomen: Normoactive bowel sounds, soft, nontender, without masses or organomegaly palpable  Extremities:  BL LE: No pretibial edema normal ROM and strength.  Mental Status: Judgment, insight: Intact Orientation: Oriented to time, place, and person Mood and affect: No depression, anxiety, or agitation     DATA REVIEWED: Results for Sabrina Mcdonald, Sabrina Mcdonald (MRN [id number]) as of 05/31/2020 08:24  Ref. Range 05/30/2020 13:50  TSH Latest Ref Range: 0.35 - 4.50 uIU/mL 0.17 (L)  T4,Free(Direct) Latest Ref Range: 0.60 - 1.60 ng/dL 0.35   Results for Sabrina Mcdonald, Sabrina Mcdonald (MRN [id number]) as of 05/30/2020 12:56  Ref. Range 04/22/2020 14:33  TRAB Latest Ref Range: <=2.00 IU/L <1.00   Thyroid Ultrasound 04/07/2020 Nodule #1 isthmic,lower central, smooth margins, hyperechoic, peripheral calcifications, 2.5x1.5x2.6 cm  Nodule#2 Left mid central, smooth margins, heterogenous, no calcifications, 1.1x1x1.1 cm   Scattered nodules otherwise demonstrated within the right and left lobes of thyroid  gland with primarily hypoechoic and heterogenous echo texture      Thyroid uptake and scan 05/15/2020  Foci of increased and decreased uptake in both thyroid lobes consistent with multinodular thyroid gland.  No dominant mass.  4 hour I-131 uptake = 6.6% (normal 5-20%)  24 hour I-131 uptake = 16.9% (normal 10-30%)  IMPRESSION: Multinodular thyroid gland.  Normal 4 hour and 24 hour radio iodine uptakes.   ASSESSMENT / PLAN / RECOMMENDATIONS:   1. Subclinical Hyperthyroidism   - Pt with multiple non-specific symptoms  - No local neck symptoms - This is caused by autonomous MNG - She just started methimazole last week, repeat TFT's stable  But this is not unexpected as it will take longer for TFT's to adjust.  2. Multinodular Goiter:  - No local neck symptoms  - She is S/P FNA of the left thyroid nodule - Awaiting results     F/U in 3 months    Signed electronically by: Lyndle Herrlich, MD  [facility]  [facility] [address] [place] [place], [place] [address] Phone: [phone] FAX: [phone]      CC: Rema Jasmine, NP [address] [place] [address] Phone: [phone]  Fax: [phone]   Return to Endocrinology clinic as below: Future Appointments  Date Time Provider Department Center  08/29/2020  7:50 AM Abri Vacca, Konrad Dolores, MD [id number] None

## 2020-09-24 DIAGNOSIS — L570 Actinic keratosis: Secondary | ICD-10-CM | POA: Diagnosis not present

## 2020-10-09 DIAGNOSIS — D485 Neoplasm of uncertain behavior of skin: Secondary | ICD-10-CM | POA: Diagnosis not present

## 2020-10-09 DIAGNOSIS — L7211 Pilar cyst: Secondary | ICD-10-CM | POA: Diagnosis not present

## 2020-10-28 ENCOUNTER — Encounter (HOSPITAL_COMMUNITY): Payer: Self-pay | Admitting: Emergency Medicine

## 2020-10-28 ENCOUNTER — Emergency Department (HOSPITAL_COMMUNITY): Payer: BC Managed Care – PPO

## 2020-10-28 ENCOUNTER — Other Ambulatory Visit: Payer: Self-pay

## 2020-10-28 ENCOUNTER — Emergency Department (HOSPITAL_COMMUNITY)
Admission: EM | Admit: 2020-10-28 | Discharge: 2020-10-28 | Disposition: A | Discharge status: Home / Self Care | Payer: BC Managed Care – PPO | Attending: Emergency Medicine | Admitting: Emergency Medicine

## 2020-10-28 DIAGNOSIS — R531 Weakness: Secondary | ICD-10-CM

## 2020-10-28 DIAGNOSIS — U071 COVID-19: Secondary | ICD-10-CM | POA: Insufficient documentation

## 2020-10-28 DIAGNOSIS — Z7982 Long term (current) use of aspirin: Secondary | ICD-10-CM | POA: Diagnosis not present

## 2020-10-28 DIAGNOSIS — E876 Hypokalemia: Secondary | ICD-10-CM | POA: Diagnosis not present

## 2020-10-28 DIAGNOSIS — R0602 Shortness of breath: Secondary | ICD-10-CM | POA: Diagnosis not present

## 2020-10-28 DIAGNOSIS — R5383 Other fatigue: Secondary | ICD-10-CM | POA: Diagnosis not present

## 2020-10-28 LAB — CBC WITH DIFFERENTIAL/PLATELET
Abs Immature Granulocytes: 0.04 10*3/uL (ref 0.00–0.07)
Basophils Absolute: 0 10*3/uL (ref 0.0–0.1)
Basophils Relative: 0 %
Eosinophils Absolute: 0 10*3/uL (ref 0.0–0.5)
Eosinophils Relative: 0 %
HCT: 45.4 % (ref 36.0–46.0)
Hemoglobin: 14.4 g/dL (ref 12.0–15.0)
Immature Granulocytes: 0 %
Lymphocytes Relative: 6 %
Lymphs Abs: 0.6 10*3/uL — ABNORMAL LOW (ref 0.7–4.0)
MCH: 29.1 pg (ref 26.0–34.0)
MCHC: 31.7 g/dL (ref 30.0–36.0)
MCV: 91.9 fL (ref 80.0–100.0)
Monocytes Absolute: 0.4 10*3/uL (ref 0.1–1.0)
Monocytes Relative: 4 %
Neutro Abs: 9.6 10*3/uL — ABNORMAL HIGH (ref 1.7–7.7)
Neutrophils Relative %: 90 %
Platelets: 235 10*3/uL (ref 150–400)
RBC: 4.94 MIL/uL (ref 3.87–5.11)
RDW: 14.7 % (ref 11.5–15.5)
WBC: 10.7 10*3/uL — ABNORMAL HIGH (ref 4.0–10.5)
nRBC: 0 % (ref 0.0–0.2)

## 2020-10-28 LAB — D-DIMER, QUANTITATIVE: D-Dimer, Quant: 0.29 ug/mL-FEU (ref 0.00–0.50)

## 2020-10-28 LAB — COMPREHENSIVE METABOLIC PANEL
ALT: 19 U/L (ref 0–44)
AST: 21 U/L (ref 15–41)
Albumin: 3.9 g/dL (ref 3.5–5.0)
Alkaline Phosphatase: 51 U/L (ref 38–126)
Anion gap: 15 (ref 5–15)
BUN: 23 mg/dL (ref 8–23)
CO2: 20 mmol/L — ABNORMAL LOW (ref 22–32)
Calcium: 9.9 mg/dL (ref 8.9–10.3)
Chloride: 102 mmol/L (ref 98–111)
Creatinine, Ser: 0.56 mg/dL (ref 0.44–1.00)
GFR, Estimated: >60 mL/min (ref >60)
Glucose, Bld: 138 mg/dL — ABNORMAL HIGH (ref 70–99)
Potassium: 2.8 mmol/L — ABNORMAL LOW (ref 3.5–5.1)
Sodium: 137 mmol/L (ref 135–145)
Total Bilirubin: 0.9 mg/dL (ref 0.3–1.2)
Total Protein: 8 g/dL (ref 6.5–8.1)

## 2020-10-28 LAB — MAGNESIUM: Magnesium: 2.3 mg/dL (ref 1.7–2.4)

## 2020-10-28 LAB — LACTATE DEHYDROGENASE: LDH: 215 U/L — ABNORMAL HIGH (ref 98–192)

## 2020-10-28 LAB — FERRITIN: Ferritin: 114 ng/mL (ref 11–307)

## 2020-10-28 LAB — PROCALCITONIN: Procalcitonin: 0.21 ng/mL

## 2020-10-28 LAB — TRIGLYCERIDES: Triglycerides: 141 mg/dL (ref <150)

## 2020-10-28 LAB — LACTIC ACID, PLASMA
Lactic Acid, Venous: 1 mmol/L (ref 0.5–1.9)
Lactic Acid, Venous: 1.6 mmol/L (ref 0.5–1.9)

## 2020-10-28 LAB — C-REACTIVE PROTEIN: CRP: 19.9 mg/dL — ABNORMAL HIGH (ref <1.0)

## 2020-10-28 LAB — FIBRINOGEN: Fibrinogen: >800 mg/dL — ABNORMAL HIGH (ref 210–475)

## 2020-10-28 MED ORDER — KLOR-CON M20 20 MEQ PO TBCR: 20.0000 meq | EXTENDED_RELEASE_TABLET | Freq: Two times a day (BID) | ORAL | 0 refills | Status: DC

## 2020-10-28 MED ORDER — POTASSIUM CHLORIDE 10 MEQ/100ML IV SOLN
10.0000 meq | INTRAVENOUS | Status: AC
  Administered 2020-10-28 (×2): 10 meq via INTRAVENOUS
  Filled 2020-10-28 (×2): qty 100

## 2020-10-28 MED ORDER — METHYLPREDNISOLONE 4 MG PO TBPK: ORAL_TABLET | ORAL | 0 refills | Status: DC

## 2020-10-28 MED ORDER — POTASSIUM CHLORIDE 10 MEQ/100ML IV SOLN: 10.0000 meq | INTRAVENOUS | Status: DC

## 2020-10-28 MED ORDER — POTASSIUM CHLORIDE CRYS ER 20 MEQ PO TBCR
60.0000 meq | EXTENDED_RELEASE_TABLET | Freq: Once | ORAL | Status: AC
  Administered 2020-10-28: 60 meq via ORAL
  Filled 2020-10-28: qty 3

## 2020-10-28 MED ORDER — DEXAMETHASONE SODIUM PHOSPHATE 10 MG/ML IJ SOLN
10.0000 mg | Freq: Once | INTRAMUSCULAR | Status: AC
  Administered 2020-10-28: 10 mg via INTRAVENOUS
  Filled 2020-10-28: qty 1

## 2020-10-28 NOTE — ED Notes (Signed)
Pt waiting on ride

## 2020-10-28 NOTE — ED Notes (Signed)
o2 was 95% while walking

## 2020-10-28 NOTE — Discharge Instructions (Addendum)
Contact a health care provider if you: Have weakness that gets worse. Feel your heart pounding or racing. Vomit. Have diarrhea. Have diabetes (diabetes mellitus) and you have trouble keeping your blood sugar (glucose) in your target range. Get help right away if you: Have chest pain. Have significantly worsening shortness of breath. You become difficult to arouse or confused. Have vomiting or diarrhea that lasts for more than 2 days. Faint.

## 2020-10-28 NOTE — ED Notes (Signed)
Ambulated at 95% RA

## 2020-10-28 NOTE — ED Provider Notes (Signed)
[facility] EMERGENCY DEPARTMENT Provider Note   CSN: [id number] Arrival date & time: 10/28/20  0749     History Chief Complaint  Patient presents with  . Shortness of Breath    Sabrina Mcdonald is a 65 y.o. female who presents emergency department with symptoms of coronavirus.  She had a confirmatory positive Covid test on 10/22/2020 which is when her symptoms began.  She had severe fatigue, headache, sore throat and cough.  Patient received the monoclonal antibody infusion on 10/24/2020.  She states that she felt as if she was getting better on Saturday however her symptoms worsen severely yesterday.  She had significant increase in her fatigue and lightheadedness along with severe pain between her shoulder blades which is worse with deep breathing or coughing.  She has associated exertional dyspnea.  She has had subjective fever at home.  HPI     Past Medical History:  Diagnosis Date  . Abnormal EKG   . Anxiety   . Fatigue   . Heat intolerance   . Jaw pain   . Migraine   . Renal disorder     Patient Active Problem List   Diagnosis Date Noted  . Hyperthyroidism 04/22/2020  . Multinodular goiter 04/22/2020  . Constipation 01/15/2020  . History of colonic polyps 01/15/2020  . Migraine   . Anxiety   . Fatigue   . Jaw pain   . Abnormal EKG   . Heat intolerance     Past Surgical History:  Procedure Laterality Date  . APPENDECTOMY    . BRAIN SURGERY    . CESAREAN SECTION    . COLONOSCOPY WITH PROPOFOL N/A 04/14/2020   Procedure: COLONOSCOPY WITH PROPOFOL;  Surgeon: Corbin Ade, MD;  [facility];  Service: Endoscopy;  Laterality: N/A;  7:30am  . kidnsey stones     . laproscopy       OB History   No obstetric history on file.     Family History  Problem Relation Age of Onset  . Colon cancer Neg Hx     Social History   Tobacco Use  . Smoking status: Never Smoker  . Smokeless tobacco: Never Used  Vaping Use  . Vaping Use: Never used   Substance Use Topics  . Alcohol use: No  . Drug use: No    Home Medications Prior to Admission medications   Medication Sig Start Date End Date Taking? Authorizing Provider  acetaminophen (TYLENOL) 325 MG tablet Take 325 mg by mouth daily.   Yes [provider]  albuterol (VENTOLIN HFA) 108 (90 Base) MCG/ACT inhaler Inhale 1-2 puffs into the lungs every 6 (six) hours as needed for wheezing or shortness of breath. 04/04/20  Yes [provider]  amLODipine (NORVASC) 5 MG tablet Take 5 mg by mouth daily. 10/16/20  Yes [provider]  aspirin EC 81 MG tablet Take 81 mg by mouth daily. Swallow whole.   Yes [provider]  BROVANA 15 MCG/2ML NEBU Take 15 mcg by nebulization daily. 03/04/20  Yes [provider]  buPROPion (WELLBUTRIN XL) 150 MG 24 hr tablet Take 150 mg by mouth daily. 02/25/20  Yes [provider]  busPIRone (BUSPAR) 5 MG tablet Take 5 mg by mouth 2 (two) times daily as needed for anxiety. 11/21/19  Yes [provider]  clarithromycin (BIAXIN) 500 MG tablet Take 500 mg by mouth See admin instructions. Take 1 tablet (500 mg) by mouth twice daily on Mondays, Wednesdays, & Fridays.  Yes [provider]  DUPIXENT 300 MG/2ML prefilled syringe Inject 300 mg into the skin every 14 (fourteen) days. 03/18/20  Yes [provider]  escitalopram (LEXAPRO) 20 MG tablet Take 20 mg by mouth at bedtime. 03/28/20  Yes [provider]  fluticasone (FLONASE) 50 MCG/ACT nasal spray Place 1 spray into both nostrils daily.  04/08/13  Yes [provider]  hydrOXYzine (ATARAX/VISTARIL) 25 MG tablet Take 25 mg by mouth 3 (three) times daily as needed for anxiety. 04/03/20  Yes [provider]  ibuprofen (ADVIL) 200 MG tablet Take 200 mg by mouth daily.   Yes [provider]  KLOR-CON M20 20 MEQ tablet Take 20 mEq by mouth daily. 03/27/20  Yes [provider]  loratadine (CLARITIN) 10 MG  tablet Take 20 mg by mouth at bedtime.   Yes [provider]  losartan-hydrochlorothiazide (HYZAAR) 100-25 MG per tablet Take 1 tablet by mouth daily. 07/11/13 04/09/21 Yes Acey Lav, MD  methimazole (TAPAZOLE) 5 MG tablet Take 1 tablet (5 mg total) by mouth daily. 05/21/20  Yes Shamleffer, Konrad Dolores, MD  montelukast (SINGULAIR) 10 MG tablet Take 10 mg by mouth at bedtime. 03/05/20  Yes [provider]  predniSONE (DELTASONE) 5 MG tablet Take 5 mg by mouth daily with breakfast.    Yes [provider]  Vitamin D, Ergocalciferol, (DRISDOL) 1.25 MG (50000 UNIT) CAPS capsule Take 50,000 Units by mouth every Sunday. 03/27/20  Yes [provider]    Allergies    Phenobarbital  Review of Systems   Review of Systems Ten systems reviewed and are negative for acute change, except as noted in the HPI.   Physical Exam Updated Vital Signs BP 130/73   Pulse 91   Temp 98.5 F (36.9 C) (Oral)   Resp 19   SpO2 95%   Physical Exam Vitals and nursing note reviewed.  Constitutional:      General: She is not in acute distress.    Appearance: She is well-developed and well-nourished. She is ill-appearing. She is not diaphoretic.  HENT:     Head: Normocephalic and atraumatic.  Eyes:     General: No scleral icterus.    Conjunctiva/sclera: Conjunctivae normal.  Cardiovascular:     Rate and Rhythm: Normal rate and regular rhythm.     Heart sounds: Normal heart sounds. No murmur heard. No friction rub. No gallop.   Pulmonary:     Effort: Pulmonary effort is normal. No respiratory distress.     Breath sounds: Normal breath sounds. No wheezing or rhonchi.  Abdominal:     General: Bowel sounds are normal. There is no distension.     Palpations: Abdomen is soft. There is no mass.     Tenderness: There is no abdominal tenderness. There is no guarding.  Musculoskeletal:     Cervical back: Normal range of motion.     Right lower leg: No edema.     Left lower  leg: No edema.  Skin:    General: Skin is warm and dry.  Neurological:     Mental Status: She is alert and oriented to person, place, and time.  Psychiatric:        Behavior: Behavior normal.     ED Results / Procedures / Treatments   Labs (all labs ordered are listed, but only abnormal results are displayed) Labs Reviewed  RESP PANEL BY RT-PCR (FLU A&B, COVID) [id number]  CULTURE, BLOOD (ROUTINE X 2)  CULTURE, BLOOD (ROUTINE X 2)  LACTIC  ACID, PLASMA  LACTIC ACID, PLASMA  CBC WITH DIFFERENTIAL/PLATELET  COMPREHENSIVE METABOLIC PANEL  D-DIMER, QUANTITATIVE (NOT AT [facility])  PROCALCITONIN  LACTATE DEHYDROGENASE  FERRITIN  TRIGLYCERIDES  FIBRINOGEN  C-REACTIVE PROTEIN    EKG EKG Interpretation  Date/Time:  Tuesday October 28 2020 08:28:25 EST Ventricular Rate:  94 PR Interval:    QRS Duration: 83 QT Interval:  344 QTC Calculation: 431 R Axis:   -51 Text Interpretation: Sinus rhythm Inferior infarct, old Anterior infarct, old No significant change since last tracing Confirmed by Susy Frizzle [phone]) on 10/28/2020 9:40:13 AM   Radiology DG Chest Portable 1 View  Result Date: 10/28/2020 CLINICAL DATA:  Shortness of breath EXAM: PORTABLE CHEST 1 VIEW COMPARISON:  10/02/2013 FINDINGS: Cardiac shadow is within normal limits. The lungs are well aerated bilaterally. Mild patchy airspace opacity is noted in the bases bilaterally left greater than right suggestive of early multifocal pneumonia. This would be consistent with the patient's given clinical history of prior COVID-19 positivity. No bony abnormality is noted. Postsurgical changes in the cervical spine are seen. IMPRESSION: Patchy bibasilar airspace opacities consistent with the given clinical history Electronically Signed   By: Alcide Clever M.D.   On: 10/28/2020 09:15    Procedures .Critical Care Performed by: Arthor Captain, PA-C Authorized by: Arthor Captain, PA-C   Critical care provider statement:    Critical care  time (minutes):  45   Critical care time was exclusive of:  Separately billable procedures and treating other patients   Critical care was necessary to treat or prevent imminent or life-threatening deterioration of the following conditions:  Metabolic crisis (Hypokalemia and weakness)   Critical care was time spent personally by me on the following activities:  Discussions with consultants, evaluation of patient's response to treatment, examination of patient, ordering and performing treatments and interventions, ordering and review of laboratory studies, ordering and review of radiographic studies, pulse oximetry, re-evaluation of patient's condition, obtaining history from patient or surrogate and review of old charts   (including critical care time)  Medications Ordered in ED Medications  dexamethasone (DECADRON) injection 10 mg (has no administration in time range)    ED Course  I have reviewed the triage vital signs and the nursing notes.  Pertinent labs & imaging results that were available during my care of the patient were reviewed by me and considered in my medical decision making (see chart for details).  Clinical Course as of 10/28/20 1134  Tue Oct 28, 2020  1133 Potassium(!): 2.8 [AH]    Clinical Course User Index [AH] Arthor Captain, PA-C   MDM Rules/Calculators/A&P                          CC: Shortness of breath and weakness VS:  Vitals:   10/28/20 1445 10/28/20 1500 10/28/20 1515 10/28/20 1853  BP:  (!) 146/77 (!) 152/78 (!) 157/78  Pulse: 96 (!) 102 (!) 102 100  Resp:   18 18  Temp:      TempSrc:      SpO2: 93% 95% 98% 99%    OZ:HYQMVHQ is gathered by patient and EMR. Previous records obtained and reviewed. DDX:The patient's complaint of shortness of breath involves an extensive number of diagnostic and treatment options, and is a complaint that carries with it a high risk of complications, morbidity, and potential mortality. Given the large differential  diagnosis, medical decision making is of high complexity. The emergent differential diagnosis for shortness of breath includes,  but is not limited to, Pulmonary edema, bronchoconstriction, Pneumonia, Pulmonary embolism, Pneumotherax/ Hemothorax, Dysrythmia, ACS.   Labs: I ordered reviewed and interpreted labs which include CBC which shows mildly elevated white blood cell count of insignificant value.  CMP shows potassium of 2.8 .  Lactate dehydrogenase, CRP and fibrinogen elevated.  Lactic acid magnesium D-dimer procalcitonin and ferritin all within normal limits Imaging: I ordered and reviewed images which included portable 1 view chest x-ray. I independently visualized and interpreted all imaging. Significant findings include patchy bibasilar airspace disease consistent with Covid pneumonia.  EKG: Sinus rhythm at a rate of 94 Consults: MDM: Patient here with COVID-19 infection.  Oxygen saturations are slightly low throughout her ED visit but have not dropped below 90% at all.  She patient was also able to ambulate here in the emergency department with oxygen saturations above 94%.  She was given IV steroids.  Patient also with significant hypokalemia.  She has this chronically.  I think this is contributing to her overall weakness along with her viral illness patient was given oral and multiple rounds of IV potassium.  She will be discharged with an increase in her potassium over the next few days, close outpatient follow-up for reevaluation and continued home quarantine with return precautions. Patient disposition:The patient appears reasonably screened and/or stabilized for discharge and I doubt any other medical condition or other [facility] requiring further screening, evaluation, or treatment in the ED at this time prior to discharge. I have discussed lab and/or imaging findings with the patient and answered all questions/concerns to the best of my ability.I have discussed return precautions and OP follow  up.    Sabrina Mcdonald was evaluated in Emergency Department on 10/28/2020 for the symptoms described in the history of present illness. She was evaluated in the context of the global COVID-19 pandemic, which necessitated consideration that the patient might be at risk for infection with the SARS-CoV-2 virus that causes COVID-19. Institutional protocols and algorithms that pertain to the evaluation of patients at risk for COVID-19 are in a state of rapid change based on information released by regulatory bodies including the CDC and federal and state organizations. These policies and algorithms were followed during the patient's care in the ED.  Final Clinical Impression(s) / ED Diagnoses Final diagnoses:  None    Rx / DC Orders ED Discharge Orders    None       Margarita Mail, PA-C 10/28/20 1901    Truddie Hidden, MD 10/29/20 [phone]

## 2020-10-28 NOTE — ED Triage Notes (Signed)
Pt reports SHOB and pain in the middle of her back that started last night. Pt diagnosed with COVID 12/29.

## 2020-10-29 ENCOUNTER — Other Ambulatory Visit: Payer: Self-pay

## 2020-10-29 ENCOUNTER — Emergency Department (HOSPITAL_COMMUNITY)
Admission: EM | Admit: 2020-10-29 | Discharge: 2020-10-29 | Disposition: A | Discharge status: Home / Self Care | Payer: BC Managed Care – PPO | Attending: Emergency Medicine | Admitting: Emergency Medicine

## 2020-10-29 DIAGNOSIS — U071 COVID-19: Secondary | ICD-10-CM | POA: Diagnosis not present

## 2020-10-29 DIAGNOSIS — R7881 Bacteremia: Secondary | ICD-10-CM | POA: Diagnosis not present

## 2020-10-29 DIAGNOSIS — R895 Abnormal microbiological findings in specimens from other organs, systems and tissues: Secondary | ICD-10-CM | POA: Diagnosis not present

## 2020-10-29 DIAGNOSIS — R799 Abnormal finding of blood chemistry, unspecified: Secondary | ICD-10-CM | POA: Diagnosis not present

## 2020-10-29 LAB — CBC WITH DIFFERENTIAL/PLATELET
Abs Immature Granulocytes: 0.15 10*3/uL — ABNORMAL HIGH (ref 0.00–0.07)
Basophils Absolute: 0 10*3/uL (ref 0.0–0.1)
Basophils Relative: 0 %
Eosinophils Absolute: 0 10*3/uL (ref 0.0–0.5)
Eosinophils Relative: 0 %
HCT: 39.4 % (ref 36.0–46.0)
Hemoglobin: 12.7 g/dL (ref 12.0–15.0)
Immature Granulocytes: 1 %
Lymphocytes Relative: 4 %
Lymphs Abs: 0.5 10*3/uL — ABNORMAL LOW (ref 0.7–4.0)
MCH: 29.5 pg (ref 26.0–34.0)
MCHC: 32.2 g/dL (ref 30.0–36.0)
MCV: 91.4 fL (ref 80.0–100.0)
Monocytes Absolute: 0.3 10*3/uL (ref 0.1–1.0)
Monocytes Relative: 2 %
Neutro Abs: 11.6 10*3/uL — ABNORMAL HIGH (ref 1.7–7.7)
Neutrophils Relative %: 93 %
Platelets: 266 10*3/uL (ref 150–400)
RBC: 4.31 MIL/uL (ref 3.87–5.11)
RDW: 14.4 % (ref 11.5–15.5)
WBC: 12.5 10*3/uL — ABNORMAL HIGH (ref 4.0–10.5)
nRBC: 0 % (ref 0.0–0.2)

## 2020-10-29 LAB — BASIC METABOLIC PANEL
Anion gap: 11 (ref 5–15)
BUN: 28 mg/dL — ABNORMAL HIGH (ref 8–23)
CO2: 21 mmol/L — ABNORMAL LOW (ref 22–32)
Calcium: 10.2 mg/dL (ref 8.9–10.3)
Chloride: 104 mmol/L (ref 98–111)
Creatinine, Ser: 0.65 mg/dL (ref 0.44–1.00)
GFR, Estimated: >60 mL/min (ref >60)
Glucose, Bld: 227 mg/dL — ABNORMAL HIGH (ref 70–99)
Potassium: 3.2 mmol/L — ABNORMAL LOW (ref 3.5–5.1)
Sodium: 136 mmol/L (ref 135–145)

## 2020-10-29 LAB — URINALYSIS, ROUTINE W REFLEX MICROSCOPIC
Bacteria, UA: NONE SEEN
Bilirubin Urine: NEGATIVE
Glucose, UA: >=500 mg/dL — AB
Hgb urine dipstick: NEGATIVE
Ketones, ur: NEGATIVE mg/dL
Leukocytes,Ua: NEGATIVE
Nitrite: NEGATIVE
Protein, ur: 100 mg/dL — AB
Specific Gravity, Urine: 1.026 (ref 1.005–1.030)
pH: 5 (ref 5.0–8.0)

## 2020-10-29 LAB — BLOOD CULTURE ID PANEL (REFLEXED) - BCID2

## 2020-10-29 MED ORDER — LIDOCAINE HCL (PF) 1 % IJ SOLN
INTRAMUSCULAR | Status: AC
  Administered 2020-10-29: 2.1 mL
  Filled 2020-10-29: qty 30

## 2020-10-29 MED ORDER — CEFTRIAXONE SODIUM 1 G IJ SOLR
1.0000 g | Freq: Once | INTRAMUSCULAR | Status: AC
  Administered 2020-10-29: 1 g via INTRAMUSCULAR
  Filled 2020-10-29: qty 10

## 2020-10-29 NOTE — ED Notes (Addendum)
Pt with positive aerobic gram + blood culture. Dr Estell Harpin notified and instructed to tell pt if she is worse to follow up here, and if so follow up with family doctor . Called and left message to call us back with results

## 2020-10-29 NOTE — ED Notes (Signed)
No adverse reaction to IM injection

## 2020-10-29 NOTE — ED Triage Notes (Signed)
Pt is here due to positive blood cultures. She tested positive for covid on 12/29.  Pt was seen in the ED yesterday.

## 2020-10-29 NOTE — Discharge Instructions (Addendum)
The first blood culture may have been a contaminate.  Your second blood culture result should result soon.  You will be contacted if the second culture is positive.  Your potassium level is improving from yesterday.  Today's level  is 3.2.  I recommend that you continue taking your potassium twice daily.  Follow-up with your primary care provider for recheck.

## 2020-10-29 NOTE — Progress Notes (Signed)
PHARMACY - PHYSICIAN COMMUNICATION CRITICAL VALUE ALERT - BLOOD CULTURE IDENTIFICATION (BCID)  Sabrina Mcdonald is an 65 y.o. female who presented to [facility] on 10/29/2020 with a chief complaint of Covid  Assessment:  BCID positive for gram + cocci coag - staph in 1 set (include suspected source if known)  Name of physician (or Provider) Contacted: Dr Estell Harpin  Current antibiotics: ceftriaxone 1 gm x 1   Changes to prescribed antibiotics recommended: No continued abx indicated at this time. Likely contaminant Response not received from provider;  current antibiotics are likely to cover the isolated organism.  Consider de-escalation soon.  Results for orders placed or performed during the hospital encounter of 10/28/20  Blood Culture ID Panel (Reflexed) (Collected: 10/28/2020 11:47 AM)  Result Value Ref Range   Enterococcus faecalis NOT DETECTED NOT DETECTED   Enterococcus Faecium NOT DETECTED NOT DETECTED   Listeria monocytogenes NOT DETECTED NOT DETECTED   Staphylococcus species DETECTED (A) NOT DETECTED   Staphylococcus aureus (BCID) NOT DETECTED NOT DETECTED   Staphylococcus epidermidis NOT DETECTED NOT DETECTED   Staphylococcus lugdunensis NOT DETECTED NOT DETECTED   Streptococcus species NOT DETECTED NOT DETECTED   Streptococcus agalactiae NOT DETECTED NOT DETECTED   Streptococcus pneumoniae NOT DETECTED NOT DETECTED   Streptococcus pyogenes NOT DETECTED NOT DETECTED   A.calcoaceticus-baumannii NOT DETECTED NOT DETECTED   Bacteroides fragilis NOT DETECTED NOT DETECTED   Enterobacterales NOT DETECTED NOT DETECTED   Enterobacter cloacae complex NOT DETECTED NOT DETECTED   Escherichia coli NOT DETECTED NOT DETECTED   Klebsiella aerogenes NOT DETECTED NOT DETECTED   Klebsiella oxytoca NOT DETECTED NOT DETECTED   Klebsiella pneumoniae NOT DETECTED NOT DETECTED   Proteus species NOT DETECTED NOT DETECTED   Salmonella species NOT DETECTED NOT DETECTED   Serratia marcescens NOT  DETECTED NOT DETECTED   Haemophilus influenzae NOT DETECTED NOT DETECTED   Neisseria meningitidis NOT DETECTED NOT DETECTED   Pseudomonas aeruginosa NOT DETECTED NOT DETECTED   Stenotrophomonas maltophilia NOT DETECTED NOT DETECTED   Candida albicans NOT DETECTED NOT DETECTED   Candida auris NOT DETECTED NOT DETECTED   Candida glabrata NOT DETECTED NOT DETECTED   Candida krusei NOT DETECTED NOT DETECTED   Candida parapsilosis NOT DETECTED NOT DETECTED   Candida tropicalis NOT DETECTED NOT DETECTED   Cryptococcus neoformans/gattii NOT DETECTED NOT DETECTED    [name] 10/29/2020  5:26 PM

## 2020-10-30 NOTE — ED Provider Notes (Signed)
[facility] EMERGENCY DEPARTMENT Provider Note   CSN: [id number] Arrival date & time: 10/29/20  1434     History Chief Complaint  Patient presents with  . Abnormal Lab    Sabrina Mcdonald is a 65 y.o. female.  HPI     Sabrina Mcdonald is a 65 y.o. female who presents to the Emergency Department stating that she was contacted at home and informed of a positive blood culture report. Patient was seen here 1 day prior to arrival and had a work-up for possible sepsis and hypokalemia. She was given IV and oral potassium during her visit. Blood cultures were obtained. She was contacted at home and advised that she had a positive blood culture. Patient was confirmed to have coronavirus infection on 10/22/2020 she has continued to have generalized body aches, fatigue, intermittent dizziness, cough and headache. She did receive a monoclonal antibody infusion on 10/24/2020 in which she had temporary improvement of her symptoms, but began to feel worse several days later. She was found yesterday to have a potassium level of 2.8 and was given IV and oral contrast along with prescription. She also received Decadron. She denies shortness of breath, abdominal pain, and fever   Past Medical History:  Diagnosis Date  . Abnormal EKG   . Anxiety   . Fatigue   . Heat intolerance   . Jaw pain   . Migraine   . Renal disorder     Patient Active Problem List   Diagnosis Date Noted  . Hyperthyroidism 04/22/2020  . Multinodular goiter 04/22/2020  . Constipation 01/15/2020  . History of colonic polyps 01/15/2020  . Migraine   . Anxiety   . Fatigue   . Jaw pain   . Abnormal EKG   . Heat intolerance     Past Surgical History:  Procedure Laterality Date  . APPENDECTOMY    . BRAIN SURGERY    . CESAREAN SECTION    . COLONOSCOPY WITH PROPOFOL N/A 04/14/2020   Procedure: COLONOSCOPY WITH PROPOFOL;  Surgeon: Corbin Ade, MD;  [facility]: [facility];  Service: Endoscopy;  Laterality: N/A;  7:30am   . kidnsey stones     . laproscopy       OB History   No obstetric history on file.     Family History  Problem Relation Age of Onset  . Colon cancer Neg Hx     Social History   Tobacco Use  . Smoking status: Never Smoker  . Smokeless tobacco: Never Used  Vaping Use  . Vaping Use: Never used  Substance Use Topics  . Alcohol use: No  . Drug use: No    Home Medications Prior to Admission medications   Medication Sig Start Date End Date Taking? Authorizing Provider  acetaminophen (TYLENOL) 325 MG tablet Take 325 mg by mouth daily.   Yes [provider]  albuterol (VENTOLIN HFA) 108 (90 Base) MCG/ACT inhaler Inhale 1-2 puffs into the lungs every 6 (six) hours as needed for wheezing or shortness of breath. 04/04/20  Yes [provider]  amLODipine (NORVASC) 5 MG tablet Take 5 mg by mouth daily. 10/16/20  Yes [provider]  BROVANA 15 MCG/2ML NEBU Take 15 mcg by nebulization daily. 03/04/20  Yes [provider]  buPROPion (WELLBUTRIN XL) 150 MG 24 hr tablet Take 150 mg by mouth daily. 02/25/20  Yes [provider]  busPIRone (BUSPAR) 5 MG tablet Take 5 mg by mouth 2 (two) times daily as needed for anxiety.  11/21/19  Yes [provider]  clarithromycin (BIAXIN) 500 MG tablet Take 500 mg by mouth See admin instructions. Take 1 tablet (500 mg) by mouth twice daily on Mondays, Wednesdays, & Fridays.   Yes [provider]  DUPIXENT 300 MG/2ML prefilled syringe Inject 300 mg into the skin every 14 (fourteen) days. 03/18/20  Yes [provider]  escitalopram (LEXAPRO) 20 MG tablet Take 20 mg by mouth at bedtime. 03/28/20  Yes [provider]  fluticasone (FLONASE) 50 MCG/ACT nasal spray Place 1 spray into both nostrils daily.  04/08/13  Yes [provider]  ibuprofen (ADVIL) 200 MG tablet Take 200 mg by mouth daily.   Yes [provider]  KLOR-CON M20 20 MEQ tablet Take 1 tablet (20 mEq total) by  mouth 2 (two) times daily for 7 days. 10/28/20 11/04/20 Yes Harris, Abigail, PA-C  loratadine (CLARITIN) 10 MG tablet Take 20 mg by mouth at bedtime.   Yes [provider]  losartan-hydrochlorothiazide (HYZAAR) 100-25 MG per tablet Take 1 tablet by mouth daily. 07/11/13 04/09/21 Yes Doran Heater, MD  methimazole (TAPAZOLE) 5 MG tablet Take 1 tablet (5 mg total) by mouth daily. 05/21/20  Yes Shamleffer, Melanie Crazier, MD  methylPREDNISolone (MEDROL DOSEPAK) 4 MG TBPK tablet Use as directed Patient taking differently: Take 4 mg by mouth. Use as directed 10/28/20  Yes Harris, Abigail, PA-C  montelukast (SINGULAIR) 10 MG tablet Take 10 mg by mouth at bedtime. 03/05/20  Yes [provider]  predniSONE (DELTASONE) 5 MG tablet Take 5 mg by mouth 2 (two) times daily with a meal.   Yes [provider]  hydrOXYzine (ATARAX/VISTARIL) 25 MG tablet Take 25 mg by mouth 3 (three) times daily as needed for anxiety. 04/03/20   [provider]  Vitamin D, Ergocalciferol, (DRISDOL) 1.25 MG (50000 UNIT) CAPS capsule Take 50,000 Units by mouth every Sunday. 03/27/20   [provider]    Allergies    Phenobarbital  Review of Systems   Review of Systems  Constitutional: Negative for chills, fatigue and fever.  HENT: Positive for sore throat. Negative for trouble swallowing.   Eyes: Negative for visual disturbance.  Respiratory: Positive for cough. Negative for shortness of breath and wheezing.   Cardiovascular: Negative for chest pain and palpitations.  Gastrointestinal: Negative for abdominal pain, nausea and vomiting.  Genitourinary: Negative for dysuria, flank pain and hematuria.  Musculoskeletal: Positive for myalgias. Negative for arthralgias, back pain, neck pain and neck stiffness.  Skin: Negative for rash.  Neurological: Positive for dizziness and headaches. Negative for syncope, weakness and numbness.  Hematological: Does not bruise/bleed easily.   Psychiatric/Behavioral: Negative for confusion.    Physical Exam Updated Vital Signs BP (!) 156/82   Pulse 84   Temp 97.9 F (36.6 C) (Oral)   Resp 20   Ht 5\' 1"  (1.549 m)   Wt 96.2 kg   SpO2 94%   BMI 40.06 kg/m   Physical Exam Vitals and nursing note reviewed.  Constitutional:      Appearance: She is obese. She is not ill-appearing.  Eyes:     Conjunctiva/sclera: Conjunctivae normal.     Pupils: Pupils are equal, round, and reactive to light.  Cardiovascular:     Rate and Rhythm: Normal rate and regular rhythm.     Pulses: Normal pulses.  Pulmonary:     Effort: Pulmonary effort is normal.     Breath sounds: Normal breath sounds.  Chest:     Chest wall: No tenderness.  Abdominal:     Palpations: Abdomen is soft.     Tenderness: There is no abdominal tenderness.  Musculoskeletal:        General: Normal range of motion.     Right lower leg: No edema.     Left lower leg: No edema.  Skin:    General: Skin is warm.     Capillary Refill: Capillary refill takes less than 2 seconds.     Findings: No rash.  Neurological:     General: No focal deficit present.     Mental Status: She is alert.     GCS: GCS eye subscore is 4. GCS verbal subscore is 5. GCS motor subscore is 6.     Sensory: Sensation is intact. No sensory deficit.     Motor: Motor function is intact. No weakness.     Coordination: Coordination is intact.     Gait: Gait is intact.  Psychiatric:        Thought Content: Thought content normal.     ED Results / Procedures / Treatments   Labs (all labs ordered are listed, but only abnormal results are displayed) Labs Reviewed  CBC WITH DIFFERENTIAL/PLATELET - Abnormal; Notable for the following components:      Result Value   WBC 12.5 (*)    Neutro Abs 11.6 (*)    Lymphs Abs 0.5 (*)    Abs Immature Granulocytes 0.15 (*)    All other components within normal limits  BASIC METABOLIC PANEL - Abnormal; Notable for the following components:   Potassium  3.2 (*)    CO2 21 (*)    Glucose, Bld 227 (*)    BUN 28 (*)    All other components within normal limits  URINALYSIS, ROUTINE W REFLEX MICROSCOPIC - Abnormal; Notable for the following components:   APPearance HAZY (*)    Glucose, UA >=500 (*)    Protein, ur 100 (*)    All other components within normal limits  URINE CULTURE    EKG None  Radiology No results found.  Procedures Procedures (including critical care time)  Medications Ordered in ED Medications  cefTRIAXone (ROCEPHIN) injection 1 g (1 g Intramuscular Given 10/29/20 1751)  lidocaine (PF) (XYLOCAINE) 1 % injection (2.1 mLs  Given 10/29/20 1751)    ED Course  I have reviewed the triage vital signs and the nursing notes.  Pertinent labs & imaging results that were available during my care of the patient were reviewed by me and considered in my medical decision making (see chart for details).    MDM Rules/Calculators/A&P                          Patient here for clarification of positive blood culture. Had positive COVID test 10/22/2020. She is received Mab infusion, but continues to have symptoms suggestive of coronavirus. No fever or respiratory distress. She is nontoxic-appearing. No concerning symptoms for sepsis.  Sepsis markers from yesterday were all negative. Initial blood culture showed gram-positive cocci. Given negative sepsis marker and absence of fever, the initial blood culture likely a contaminant. Since she had significant hypokalemia, we will recheck chemistries and urinalysis.  Potassium improved, now 3.2. She has oral potassium prescription. Urinalysis without evidence of infection. Patient will be given single IM dose of Rocephin here. Lab contacted and preliminary report of second blood culture shows no growth. Patient appears appropriate for discharge home, understands that she will be contacted if second culture positive. Agrees to  close outpatient follow-up with PCP regarding potassium  level.     Clinical Impression(s) / ED Diagnoses Final diagnoses:  COVID-19 virus infection  Positive blood culture    Rx / DC Orders ED Discharge Orders    None       Rosey Bath 10/30/20 2127    Bethann Berkshire, MD 11/03/20 1511

## 2020-10-31 LAB — CULTURE, BLOOD (ROUTINE X 2)

## 2020-10-31 LAB — URINE CULTURE: Culture: <10000 — AB

## 2020-11-02 LAB — CULTURE, BLOOD (ROUTINE X 2)
Culture: NO GROWTH
Special Requests: ADEQUATE

## 2020-11-08 DIAGNOSIS — U071 COVID-19: Secondary | ICD-10-CM | POA: Diagnosis not present

## 2020-12-08 ENCOUNTER — Telehealth: Payer: Self-pay | Admitting: Internal Medicine

## 2020-12-08 MED ORDER — METHIMAZOLE 5 MG PO TABS: 5.0000 mg | ORAL_TABLET | Freq: Every day | ORAL | 0 refills | Status: DC

## 2020-12-08 NOTE — Telephone Encounter (Signed)
30 days supply sent to pharmacy

## 2020-12-08 NOTE — Telephone Encounter (Signed)
Pt requests refill on Methimazole. Pt made appt for 01/07/21.  [facility] - [place], [place] - [address] Phone:  [phone]  Fax:  [phone]

## 2020-12-09 DIAGNOSIS — U071 COVID-19: Secondary | ICD-10-CM | POA: Diagnosis not present

## 2021-01-05 DIAGNOSIS — Z6841 Body Mass Index (BMI) 40.0 and over, adult: Secondary | ICD-10-CM | POA: Diagnosis not present

## 2021-01-05 DIAGNOSIS — N644 Mastodynia: Secondary | ICD-10-CM | POA: Diagnosis not present

## 2021-01-06 DIAGNOSIS — U071 COVID-19: Secondary | ICD-10-CM | POA: Diagnosis not present

## 2021-01-07 ENCOUNTER — Ambulatory Visit: Payer: BC Managed Care – PPO | Admitting: Internal Medicine

## 2021-01-07 ENCOUNTER — Encounter: Payer: Self-pay | Admitting: Internal Medicine

## 2021-01-07 ENCOUNTER — Other Ambulatory Visit: Payer: Self-pay

## 2021-01-07 VITALS — BP 136/76 | HR 95 | Ht 61.0 in | Wt 218.0 lb

## 2021-01-07 DIAGNOSIS — R7303 Prediabetes: Secondary | ICD-10-CM | POA: Diagnosis not present

## 2021-01-07 DIAGNOSIS — E042 Nontoxic multinodular goiter: Secondary | ICD-10-CM | POA: Diagnosis not present

## 2021-01-07 DIAGNOSIS — H539 Unspecified visual disturbance: Secondary | ICD-10-CM | POA: Diagnosis not present

## 2021-01-07 DIAGNOSIS — E0590 Thyrotoxicosis, unspecified without thyrotoxic crisis or storm: Secondary | ICD-10-CM

## 2021-01-07 LAB — TSH: TSH: 0.81 u[IU]/mL (ref 0.35–4.50)

## 2021-01-07 LAB — CBC WITH DIFFERENTIAL/PLATELET
Basophils Absolute: 0.1 10*3/uL (ref 0.0–0.1)
Basophils Relative: 0.6 % (ref 0.0–3.0)
Eosinophils Absolute: 0 10*3/uL (ref 0.0–0.7)
Eosinophils Relative: 0.5 % (ref 0.0–5.0)
HCT: 37.1 % (ref 36.0–46.0)
Hemoglobin: 12.8 g/dL (ref 12.0–15.0)
Lymphocytes Relative: 13.1 % (ref 12.0–46.0)
Lymphs Abs: 1.1 10*3/uL (ref 0.7–4.0)
MCHC: 34.4 g/dL (ref 30.0–36.0)
MCV: 86.7 fl (ref 78.0–100.0)
Monocytes Absolute: 0.4 10*3/uL (ref 0.1–1.0)
Monocytes Relative: 4.9 % (ref 3.0–12.0)
Neutro Abs: 6.8 10*3/uL (ref 1.4–7.7)
Neutrophils Relative %: 80.9 % — ABNORMAL HIGH (ref 43.0–77.0)
Platelets: 284 10*3/uL (ref 150.0–400.0)
RBC: 4.29 Mil/uL (ref 3.87–5.11)
RDW: 15.5 % (ref 11.5–15.5)
WBC: 8.4 10*3/uL (ref 4.0–10.5)

## 2021-01-07 LAB — COMPREHENSIVE METABOLIC PANEL
ALT: 22 U/L (ref 0–35)
AST: 15 U/L (ref 0–37)
Albumin: 4.4 g/dL (ref 3.5–5.2)
Alkaline Phosphatase: 53 U/L (ref 39–117)
BUN: 19 mg/dL (ref 6–23)
CO2: 28 mEq/L (ref 19–32)
Calcium: 10.1 mg/dL (ref 8.4–10.5)
Chloride: 104 mEq/L (ref 96–112)
Creatinine, Ser: 0.64 mg/dL (ref 0.40–1.20)
GFR: 93.31 mL/min (ref >60.00)
Glucose, Bld: 108 mg/dL — ABNORMAL HIGH (ref 70–99)
Potassium: 3.4 mEq/L — ABNORMAL LOW (ref 3.5–5.1)
Sodium: 142 mEq/L (ref 135–145)
Total Bilirubin: 0.5 mg/dL (ref 0.2–1.2)
Total Protein: 7.1 g/dL (ref 6.0–8.3)

## 2021-01-07 LAB — HEMOGLOBIN A1C: Hgb A1c MFr Bld: 6.1 % (ref 4.6–6.5)

## 2021-01-07 LAB — T4, FREE: Free T4: 0.53 ng/dL — ABNORMAL LOW (ref 0.60–1.60)

## 2021-01-07 NOTE — Progress Notes (Signed)
Name: Sabrina Mcdonald  MRN/ DOB: [id number], [birth year]    Age/ Sex: 65 y.o., female     PCP: Rema Jasmine, NP   Reason for Endocrinology Evaluation: MNG     Initial Endocrinology Clinic Visit: 04/22/2020    PATIENT IDENTIFIER: Sabrina Mcdonald is a 65 y.o., female with a past medical history of bronchiectasis, Asthma   and depression . She has followed with [facility] since 04/22/2020 for consultative assistance with management of her MNG.   HISTORICAL SUMMARY: Pt presented to her PCP with c/o fatigue, brain fogginess, and  feeling achy , was found to have a low TSH at 0.259 uIU/Ml in 03/2020 . This prompted an ultrasound showing MNG with the largest being a left mid central 1.1x1x1.1 cm nodule.   Uptake and scan showed MNG with a 24-hr uptake of I-131 uptake = 16.9% (normal 10-30%)  Since the uptake was not elevated we opted to treat with methimazole due to multiple nonspecific symptoms .Which was started in 04/2020   S/P benign FNA of the left thyroid nodule 05/2020 She is on prednisone for lung disease since 06/2018   Maternal family history with goiter  SUBJECTIVE:    Today (01/07/2021):  Sabrina Mcdonald is here for a follow up on MNG and hyperthyroidism.    Weight has been stable  Has been weak since her COVID infection in 09/2020 Has noted swelling around her face and legs as well as hypokalemia   Denies constipation or diarrhea  Denies local neck swelling , but has facial swelling Denies fever  She has been having visual changes   Methimazole 5 mg 1 tablet daily      HISTORY:  Past Medical History:  Past Medical History:  Diagnosis Date   Abnormal EKG    Anxiety    Fatigue    Heat intolerance    Jaw pain    Migraine    Renal disorder    Past Surgical History:  Past Surgical History:  Procedure Laterality Date   APPENDECTOMY     BRAIN SURGERY     CESAREAN SECTION     COLONOSCOPY WITH PROPOFOL N/A 04/14/2020   Procedure:  COLONOSCOPY WITH PROPOFOL;  Surgeon: Corbin Ade, MD;  Location: [facility];  Service: Endoscopy;  Laterality: N/A;  7:30am   kidnsey stones      laproscopy      Social History:  reports that she has never smoked. She has never used smokeless tobacco. She reports that she does not drink alcohol and does not use drugs. Family History:  Family History  Problem Relation Age of Onset   Colon cancer Neg Hx      HOME MEDICATIONS: Allergies as of 01/07/2021      Reactions   Phenobarbital Itching, Rash      Medication List       Accurate as of January 07, 2021  4:13 PM. If you have any questions, ask your nurse or doctor.        acetaminophen 325 MG tablet Commonly known as: TYLENOL Take 325 mg by mouth daily.   albuterol 108 (90 Base) MCG/ACT inhaler Commonly known as: VENTOLIN HFA Inhale 1-2 puffs into the lungs every 6 (six) hours as needed for wheezing or shortness of breath.   amLODipine 5 MG tablet Commonly known as: NORVASC Take 5 mg by mouth daily.   Brovana 15 MCG/2ML Nebu Generic drug: arformoterol Take 15 mcg by nebulization daily.   buPROPion 150 MG 24  hr tablet Commonly known as: WELLBUTRIN XL Take 150 mg by mouth daily.   busPIRone 5 MG tablet Commonly known as: BUSPAR Take 5 mg by mouth 2 (two) times daily as needed for anxiety.   clarithromycin 500 MG tablet Commonly known as: BIAXIN Take 500 mg by mouth See admin instructions. Take 1 tablet (500 mg) by mouth twice daily on Mondays, Wednesdays, & Fridays.   Dupixent 300 MG/2ML prefilled syringe Generic drug: dupilumab Inject 300 mg into the skin every 14 (fourteen) days.   escitalopram 20 MG tablet Commonly known as: LEXAPRO Take 20 mg by mouth at bedtime.   fluticasone 50 MCG/ACT nasal spray Commonly known as: FLONASE Place 1 spray into both nostrils daily.   hydrOXYzine 25 MG tablet Commonly known as: ATARAX/VISTARIL Take 25 mg by mouth 3 (three) times daily as needed for anxiety.    ibuprofen 200 MG tablet Commonly known as: ADVIL Take 200 mg by mouth daily.   Klor-Con M20 20 MEQ tablet Generic drug: potassium chloride SA Take 1 tablet (20 mEq total) by mouth 2 (two) times daily for 7 days.   loratadine 10 MG tablet Commonly known as: CLARITIN Take 20 mg by mouth at bedtime.   losartan-hydrochlorothiazide 100-25 MG tablet Commonly known as: HYZAAR Take 1 tablet by mouth daily.   methimazole 5 MG tablet Commonly known as: TAPAZOLE Take 1 tablet (5 mg total) by mouth daily.   methylPREDNISolone 4 MG Tbpk tablet Commonly known as: MEDROL DOSEPAK Use as directed What changed:   how much to take  how to take this   montelukast 10 MG tablet Commonly known as: SINGULAIR Take 10 mg by mouth at bedtime.   predniSONE 5 MG tablet Commonly known as: DELTASONE Take 5 mg by mouth 2 (two) times daily with a meal.   Vitamin D (Ergocalciferol) 1.25 MG (50000 UNIT) Caps capsule Commonly known as: DRISDOL Take 50,000 Units by mouth every Sunday.         OBJECTIVE:   PHYSICAL EXAM: VS: BP 136/76    Pulse 95    Ht 5\' 1"  (1.549 m)    Wt 218 lb (98.9 kg)    SpO2 97%    BMI 41.19 kg/m    EXAM: General: Pt appears well and is in NAD  Neck: General: Supple without adenopathy. Thyroid: Thyroid size normal.  No goiter or nodules appreciated.  Lungs: Clear with good BS bilat with no rales, rhonchi, or wheezes  Heart: Auscultation: RRR.  Extremities:  1 + edema   Mental Status: Judgment, insight: Intact Orientation: Oriented to time, place, and person Mood and affect: No depression, anxiety, or agitation     DATA REVIEWED:  Results for Sabrina Mcdonald, Sabrina Mcdonald (MRN Lauree Chandler) as of 01/08/2021 12:44  Ref. Range 01/07/2021 15:16  Sodium Latest Ref Range: 135 - 145 mEq/L 142  Potassium Latest Ref Range: 3.5 - 5.1 mEq/L 3.4 (L)  Chloride Latest Ref Range: 96 - 112 mEq/L 104  CO2 Latest Ref Range: 19 - 32 mEq/L 28  Glucose Latest Ref Range: 70 - 99 mg/dL 01/09/2021 (H)   BUN Latest Ref Range: 6 - 23 mg/dL 19  Creatinine Latest Ref Range: 0.40 - 1.20 mg/dL 751  Calcium Latest Ref Range: 8.4 - 10.5 mg/dL 0.25  Alkaline Phosphatase Latest Ref Range: 39 - 117 U/L 53  Albumin Latest Ref Range: 3.5 - 5.2 g/dL 4.4  AST Latest Ref Range: 0 - 37 U/L 15  ALT Latest Ref Range: 0 - 35 U/L 22  Total Protein Latest Ref Range: 6.0 - 8.3 g/dL 7.1  Total Bilirubin Latest Ref Range: 0.2 - 1.2 mg/dL 0.5  GFR Latest Ref Range: >60.00 mL/min 93.31  WBC Latest Ref Range: 4.0 - 10.5 K/uL 8.4  RBC Latest Ref Range: 3.87 - 5.11 Mil/uL 4.29  Hemoglobin Latest Ref Range: 12.0 - 15.0 g/dL 16.112.8  HCT Latest Ref Range: 36.0 - 46.0 % 37.1  MCV Latest Ref Range: 78.0 - 100.0 fl 86.7  MCHC Latest Ref Range: 30.0 - 36.0 g/dL 09.634.4  RDW Latest Ref Range: 11.5 - 15.5 % 15.5  Platelets Latest Ref Range: 150.0 - 400.0 K/uL 284.0  Neutrophils Latest Ref Range: 43.0 - 77.0 % 80.9 (H)  Lymphocytes Latest Ref Range: 12.0 - 46.0 % 13.1  Monocytes Relative Latest Ref Range: 3.0 - 12.0 % 4.9  Eosinophil Latest Ref Range: 0.0 - 5.0 % 0.5  Basophil Latest Ref Range: 0.0 - 3.0 % 0.6  NEUT# Latest Ref Range: 1.4 - 7.7 K/uL 6.8  Lymphocyte # Latest Ref Range: 0.7 - 4.0 K/uL 1.1  Monocyte # Latest Ref Range: 0.1 - 1.0 K/uL 0.4  Eosinophils Absolute Latest Ref Range: 0.0 - 0.7 K/uL 0.0  Basophils Absolute Latest Ref Range: 0.0 - 0.1 K/uL 0.1  Hemoglobin A1C Latest Ref Range: 4.6 - 6.5 % 6.1  TSH Latest Ref Range: 0.35 - 4.50 uIU/mL 0.81  T4,Free(Direct) Latest Ref Range: 0.60 - 1.60 ng/dL 0.450.53 (L)   Results for Lauree ChandlerMURPHY, Curtis E (MRN [id number]) as of 05/30/2020 12:56  Ref. Range 04/22/2020 14:33  TRAB Latest Ref Range: <=2.00 IU/L <1.00   Thyroid Ultrasound 04/07/2020 Nodule #1 isthmic,lower central, smooth margins, hyperechoic, peripheral calcifications, 2.5x1.5x2.6 cm  Nodule#2 Left mid central, smooth margins, heterogenous, no calcifications, 1.1x1x1.1 cm   Scattered nodules otherwise  demonstrated within the right and left lobes of thyroid gland with primarily hypoechoic and heterogenous echo texture      Thyroid uptake and scan 05/15/2020  Foci of increased and decreased uptake in both thyroid lobes consistent with multinodular thyroid gland.  No dominant mass.  4 hour I-131 uptake = 6.6% (normal 5-20%)  24 hour I-131 uptake = 16.9% (normal 10-30%)  IMPRESSION: Multinodular thyroid gland.  Normal 4 hour and 24 hour radio iodine uptakes.   ASSESSMENT / PLAN / RECOMMENDATIONS:   1. Subclinical Hyperthyroidism   - Pt with multiple non-specific symptoms  - No local neck symptoms - This is caused by autonomous MNG - TFT's today show low FT4, will reduce methimazole as below   Medication   Methimazole 5 mg, HALF a tablet daily       2. Multinodular Goiter:  - No local neck symptoms  - She is S/P FNA of the left thyroid nodule with benign cytology  - Due to repeat ultrasound by 05/2021  3. Pre-Diabetes :   - A1c 6.1 %   - Pt will be advised to reduce CHO intake  3. Cushingoid features:    - Pt with moon facies, weight gain, LE swelling , visual changes and hypokalemia  - She is on chronic steroid therapy for lung issues, unfortunately she is having side effects - She does not use her inhalers as much, I would recommend keeping her on the minimum possible steroid dose   F/U in 4 months    Signed electronically by: Lyndle HerrlichAbby Jaralla Cienna Dumais, MD  [facility]  [facility] [address] [place], [place] [address] Phone: [phone] FAX: [phone]  CC: Rema Jasmine, NP [address] [place] [address] Phone: [phone]  Fax: [phone]   Return to Endocrinology clinic as below: Future Appointments  Date Time Provider Department Center  05/20/2021  7:30 AM Antonia Culbertson, Konrad Dolores, MD [id number] None

## 2021-01-08 ENCOUNTER — Telehealth: Payer: Self-pay | Admitting: Internal Medicine

## 2021-01-08 MED ORDER — METHIMAZOLE 5 MG PO TABS: 2.5000 mg | ORAL_TABLET | Freq: Every day | ORAL | 6 refills | Status: DC

## 2021-01-08 NOTE — Telephone Encounter (Signed)
Spoken to patient and notified Dr Shamleffer's comments. Verbalized understanding.   

## 2021-01-08 NOTE — Telephone Encounter (Signed)
Sabrina Mcdonald,    PLease ask her to reduce her methimazole to half a tablet , her thyroid is doing better.    She does NOT have diabetes but she has pre-diabetes and needs to work on avoiding eating and drinking sweets    Her kidney function is normal.     Thanks   [name], MD  [facility]  [facility] [address] [place], [place] [address] Phone: [phone] FAX: [phone]

## 2021-01-28 DIAGNOSIS — R922 Inconclusive mammogram: Secondary | ICD-10-CM | POA: Diagnosis not present

## 2021-01-28 DIAGNOSIS — N644 Mastodynia: Secondary | ICD-10-CM | POA: Diagnosis not present

## 2021-02-06 DIAGNOSIS — U071 COVID-19: Secondary | ICD-10-CM | POA: Diagnosis not present

## 2021-03-02 DIAGNOSIS — H04123 Dry eye syndrome of bilateral lacrimal glands: Secondary | ICD-10-CM | POA: Diagnosis not present

## 2021-03-02 DIAGNOSIS — H04212 Epiphora due to excess lacrimation, left lacrimal gland: Secondary | ICD-10-CM | POA: Diagnosis not present

## 2021-03-02 DIAGNOSIS — H018 Other specified inflammations of eyelid: Secondary | ICD-10-CM | POA: Diagnosis not present

## 2021-03-08 DIAGNOSIS — U071 COVID-19: Secondary | ICD-10-CM | POA: Diagnosis not present

## 2021-03-09 DIAGNOSIS — L309 Dermatitis, unspecified: Secondary | ICD-10-CM | POA: Diagnosis not present

## 2021-04-08 DIAGNOSIS — U071 COVID-19: Secondary | ICD-10-CM | POA: Diagnosis not present

## 2021-04-23 DIAGNOSIS — L659 Nonscarring hair loss, unspecified: Secondary | ICD-10-CM | POA: Diagnosis not present

## 2021-04-23 DIAGNOSIS — D485 Neoplasm of uncertain behavior of skin: Secondary | ICD-10-CM | POA: Diagnosis not present

## 2021-05-08 DIAGNOSIS — U071 COVID-19: Secondary | ICD-10-CM | POA: Diagnosis not present

## 2021-05-20 ENCOUNTER — Encounter: Payer: Self-pay | Admitting: Internal Medicine

## 2021-05-20 ENCOUNTER — Ambulatory Visit: Payer: BC Managed Care – PPO | Admitting: Internal Medicine

## 2021-05-20 ENCOUNTER — Other Ambulatory Visit: Payer: Self-pay

## 2021-05-20 VITALS — BP 110/70 | HR 74 | Wt 210.6 lb

## 2021-05-20 DIAGNOSIS — E0590 Thyrotoxicosis, unspecified without thyrotoxic crisis or storm: Secondary | ICD-10-CM | POA: Diagnosis not present

## 2021-05-20 DIAGNOSIS — E042 Nontoxic multinodular goiter: Secondary | ICD-10-CM

## 2021-05-20 LAB — T4, FREE: Free T4: 0.7 ng/dL (ref 0.60–1.60)

## 2021-05-20 LAB — TSH: TSH: 0.58 u[IU]/mL (ref 0.35–5.50)

## 2021-05-20 NOTE — Progress Notes (Signed)
Name: Sabrina Mcdonald  MRN/ DOB: [id number], 07/02/1956    Age/ Sex: 65 y.o., female     PCP: Rema Jasmine, NP   Reason for Endocrinology Evaluation: MNG     Initial Endocrinology Clinic Visit: 04/22/2020    PATIENT IDENTIFIER: [name] is a 65 y.o., female with a past medical history of bronchiectasis, Asthma   and depression . She has followed with [facility] since 04/22/2020 for consultative assistance with management of her MNG.   HISTORICAL SUMMARY: Pt presented to her PCP with c/o fatigue, brain fogginess, and  feeling achy , was found to have a low TSH at 0.259 uIU/Ml in 03/2020 . This prompted an ultrasound showing MNG with the largest being a left mid central 1.1x1x1.1 cm nodule.   Uptake and scan showed MNG with a 24-hr uptake of I-131 uptake = 16.9% (normal 10-30%)  Since the uptake was not elevated we opted to treat with methimazole due to multiple nonspecific symptoms .Which was started in 04/2020   S/P benign FNA of the left thyroid nodule 05/2020 She is on prednisone for lung disease since 06/2018   Maternal family history with goiter  SUBJECTIVE:    Today (05/20/2021):  Sabrina Mcdonald is here for a follow up on MNG and hyperthyroidism.    Weight has been decreasing  Has noted hair loss, recent diagnosis with alopecia , follows with dermatology  Has noted fatigue and weakness since having COVID infection   Denies local neck swelling  She has been having visual changes   Methimazole 5 mg half a tablet daily      HISTORY:  Past Medical History:  Past Medical History:  Diagnosis Date   Abnormal EKG    Anxiety    Fatigue    Heat intolerance    Jaw pain    Migraine    Renal disorder    Past Surgical History:  Past Surgical History:  Procedure Laterality Date   APPENDECTOMY     BRAIN SURGERY     CESAREAN SECTION     COLONOSCOPY WITH PROPOFOL N/A 04/14/2020   Procedure: COLONOSCOPY WITH PROPOFOL;  Surgeon: Corbin Ade, MD;   [facility];  Service: Endoscopy;  Laterality: N/A;  7:30am   kidnsey stones      laproscopy     Social History:  reports that she has never smoked. She has never used smokeless tobacco. She reports that she does not drink alcohol and does not use drugs. Family History:  Family History  Problem Relation Age of Onset   Colon cancer Neg Hx      HOME MEDICATIONS: Allergies as of 05/20/2021       Reactions   Phenobarbital Itching, Rash        Medication List        Accurate as of May 20, 2021  7:01 AM. If you have any questions, ask your nurse or doctor.          acetaminophen 325 MG tablet Commonly known as: TYLENOL Take 325 mg by mouth daily.   albuterol 108 (90 Base) MCG/ACT inhaler Commonly known as: VENTOLIN HFA Inhale 1-2 puffs into the lungs every 6 (six) hours as needed for wheezing or shortness of breath.   amLODipine 5 MG tablet Commonly known as: NORVASC Take 5 mg by mouth daily.   Brovana 15 MCG/2ML Nebu Generic drug: arformoterol Take 15 mcg by nebulization daily.   buPROPion 150 MG 24 hr tablet Commonly known as:  WELLBUTRIN XL Take 150 mg by mouth daily.   busPIRone 5 MG tablet Commonly known as: BUSPAR Take 5 mg by mouth 2 (two) times daily as needed for anxiety.   clarithromycin 500 MG tablet Commonly known as: BIAXIN Take 500 mg by mouth See admin instructions. Take 1 tablet (500 mg) by mouth twice daily on Mondays, Wednesdays, & Fridays.   Dupixent 300 MG/2ML prefilled syringe Generic drug: dupilumab Inject 300 mg into the skin every 14 (fourteen) days.   escitalopram 20 MG tablet Commonly known as: LEXAPRO Take 20 mg by mouth at bedtime.   fluticasone 50 MCG/ACT nasal spray Commonly known as: FLONASE Place 1 spray into both nostrils daily.   hydrOXYzine 25 MG tablet Commonly known as: ATARAX/VISTARIL Take 25 mg by mouth 3 (three) times daily as needed for anxiety.   ibuprofen 200 MG tablet Commonly known as:  ADVIL Take 200 mg by mouth daily.   Klor-Con M20 20 MEQ tablet Generic drug: potassium chloride SA Take 1 tablet (20 mEq total) by mouth 2 (two) times daily for 7 days.   loratadine 10 MG tablet Commonly known as: CLARITIN Take 20 mg by mouth at bedtime.   losartan-hydrochlorothiazide 100-25 MG tablet Commonly known as: HYZAAR Take 1 tablet by mouth daily.   methimazole 5 MG tablet Commonly known as: TAPAZOLE Take 0.5 tablets (2.5 mg total) by mouth daily.   methylPREDNISolone 4 MG Tbpk tablet Commonly known as: MEDROL DOSEPAK Use as directed What changed:  how much to take how to take this   montelukast 10 MG tablet Commonly known as: SINGULAIR Take 10 mg by mouth at bedtime.   predniSONE 5 MG tablet Commonly known as: DELTASONE Take 5 mg by mouth 2 (two) times daily with a meal.   Vitamin D (Ergocalciferol) 1.25 MG (50000 UNIT) Caps capsule Commonly known as: DRISDOL Take 50,000 Units by mouth every Sunday.          OBJECTIVE:   PHYSICAL EXAM: VS: BP 110/70   Pulse 74   Wt 210 lb 9.6 oz (95.5 kg)   SpO2 94%   BMI 39.79 kg/m    EXAM: General: Pt appears well and is in NAD  Neck: General: Supple without adenopathy. Thyroid: Thyroid size normal.  No goiter or nodules appreciated.  Lungs: Clear with good BS bilat with no rales, rhonchi, or wheezes  Heart: Auscultation: RRR.  Extremities:  1 + edema   Mental Status: Judgment, insight: Intact Orientation: Oriented to time, place, and person Mood and affect: No depression, anxiety, or agitation     DATA REVIEWED: Results for Sabrina Mcdonald, Sabrina Mcdonald (MRN [id number]) as of 05/21/2021 08:21  Ref. Range 05/20/2021 08:01  TSH Latest Ref Range: 0.35 - 5.50 uIU/mL 0.58  T4,Free(Direct) Latest Ref Range: 0.60 - 1.60 ng/dL 4.33   Results for Sabrina Mcdonald, Sabrina Mcdonald (MRN [id number]) as of 01/08/2021 12:44  Ref. Range 01/07/2021 15:16  Sodium Latest Ref Range: 135 - 145 mEq/L 142  Potassium Latest Ref Range: 3.5 - 5.1 mEq/L  3.4 (L)  Chloride Latest Ref Range: 96 - 112 mEq/L 104  CO2 Latest Ref Range: 19 - 32 mEq/L 28  Glucose Latest Ref Range: 70 - 99 mg/dL 606 (H)  BUN Latest Ref Range: 6 - 23 mg/dL 19  Creatinine Latest Ref Range: 0.40 - 1.20 mg/dL 3.01  Calcium Latest Ref Range: 8.4 - 10.5 mg/dL 60.1  Alkaline Phosphatase Latest Ref Range: 39 - 117 U/L 53  Albumin Latest Ref Range: 3.5 - 5.2 g/dL 4.4  AST Latest Ref Range: 0 - 37 U/L 15  ALT Latest Ref Range: 0 - 35 U/L 22  Total Protein Latest Ref Range: 6.0 - 8.3 g/dL 7.1  Total Bilirubin Latest Ref Range: 0.2 - 1.2 mg/dL 0.5  GFR Latest Ref Range: >60.00 mL/min 93.31  WBC Latest Ref Range: 4.0 - 10.5 K/uL 8.4  RBC Latest Ref Range: 3.87 - 5.11 Mil/uL 4.29  Hemoglobin Latest Ref Range: 12.0 - 15.0 g/dL 94.1  HCT Latest Ref Range: 36.0 - 46.0 % 37.1  MCV Latest Ref Range: 78.0 - 100.0 fl 86.7  MCHC Latest Ref Range: 30.0 - 36.0 g/dL 74.0  RDW Latest Ref Range: 11.5 - 15.5 % 15.5  Platelets Latest Ref Range: 150.0 - 400.0 K/uL 284.0  Neutrophils Latest Ref Range: 43.0 - 77.0 % 80.9 (H)  Lymphocytes Latest Ref Range: 12.0 - 46.0 % 13.1  Monocytes Relative Latest Ref Range: 3.0 - 12.0 % 4.9  Eosinophil Latest Ref Range: 0.0 - 5.0 % 0.5  Basophil Latest Ref Range: 0.0 - 3.0 % 0.6  NEUT# Latest Ref Range: 1.4 - 7.7 K/uL 6.8  Lymphocyte # Latest Ref Range: 0.7 - 4.0 K/uL 1.1  Monocyte # Latest Ref Range: 0.1 - 1.0 K/uL 0.4  Eosinophils Absolute Latest Ref Range: 0.0 - 0.7 K/uL 0.0  Basophils Absolute Latest Ref Range: 0.0 - 0.1 K/uL 0.1  Hemoglobin A1C Latest Ref Range: 4.6 - 6.5 % 6.1   Results for Sabrina Mcdonald, Sabrina Mcdonald (MRN [id number]) as of 05/30/2020 12:56  Ref. Range 04/22/2020 14:33  TRAB Latest Ref Range: <=2.00 IU/L <1.00   Thyroid Ultrasound 04/07/2020 Nodule #1 isthmic,lower central, smooth margins, hyperechoic, peripheral calcifications, 2.5x1.5x2.6 cm   Nodule#2 Left mid central, smooth margins, heterogenous, no calcifications, 1.1x1x1.1 cm     Scattered nodules otherwise demonstrated within the right and left lobes of thyroid gland with primarily hypoechoic and heterogenous echo texture      Thyroid uptake and scan 05/15/2020  Foci of increased and decreased uptake in both thyroid lobes consistent with multinodular thyroid gland.   No dominant mass.   4 hour I-131 uptake = 6.6% (normal 5-20%)   24 hour I-131 uptake = 16.9% (normal 10-30%)   IMPRESSION: Multinodular thyroid gland.   Normal 4 hour and 24 hour radio iodine uptakes.   ASSESSMENT / PLAN / RECOMMENDATIONS:   Subclinical Hyperthyroidism    - Pt with multiple non-specific symptoms  - No local neck symptoms - This is caused by autonomous MNG - TFT's today show normal results, will continue current dose of methimazole     Medication   Methimazole 5 mg, HALF a tablet daily       2. Multinodular Goiter:   - No local neck symptoms  - She is S/P FNA of the left thyroid nodule with benign cytology  - Due to repeat ultrasound by 05/2021,printed order provided to go to [facility]     3. Cushingoid features:     - She is on chronic steroid therapy for lung issues  F/U in 4 months    Signed electronically by: Lyndle Herrlich, MD  [facility]  [facility] [address] [place], [place] [address] Phone: [phone] FAX: [phone]      CC: Rema Jasmine, NP [address] [place] [address] Phone: [phone]  Fax: [phone]   Return to Endocrinology clinic as below: Future Appointments  Date Time Provider Department Center  05/20/2021  7:30 AM Giamarie Bueche, Konrad Dolores, MD [id number]  None

## 2021-05-21 ENCOUNTER — Encounter: Payer: Self-pay | Admitting: Internal Medicine

## 2021-06-04 ENCOUNTER — Other Ambulatory Visit: Payer: Self-pay | Admitting: Internal Medicine

## 2021-06-08 DIAGNOSIS — U071 COVID-19: Secondary | ICD-10-CM | POA: Diagnosis not present

## 2021-06-22 ENCOUNTER — Encounter: Payer: Self-pay | Admitting: Internal Medicine

## 2021-06-30 ENCOUNTER — Other Ambulatory Visit: Payer: Self-pay | Admitting: Internal Medicine

## 2021-07-09 DIAGNOSIS — U071 COVID-19: Secondary | ICD-10-CM | POA: Diagnosis not present

## 2021-09-11 IMAGING — DX DG CHEST 1V PORT
1 series · 1 of 1 positions shown · non-contrast
Comparison: 10/02/2013

CLINICAL DATA: Shortness of breath

EXAM:
PORTABLE CHEST 1 VIEW

[chest ap]
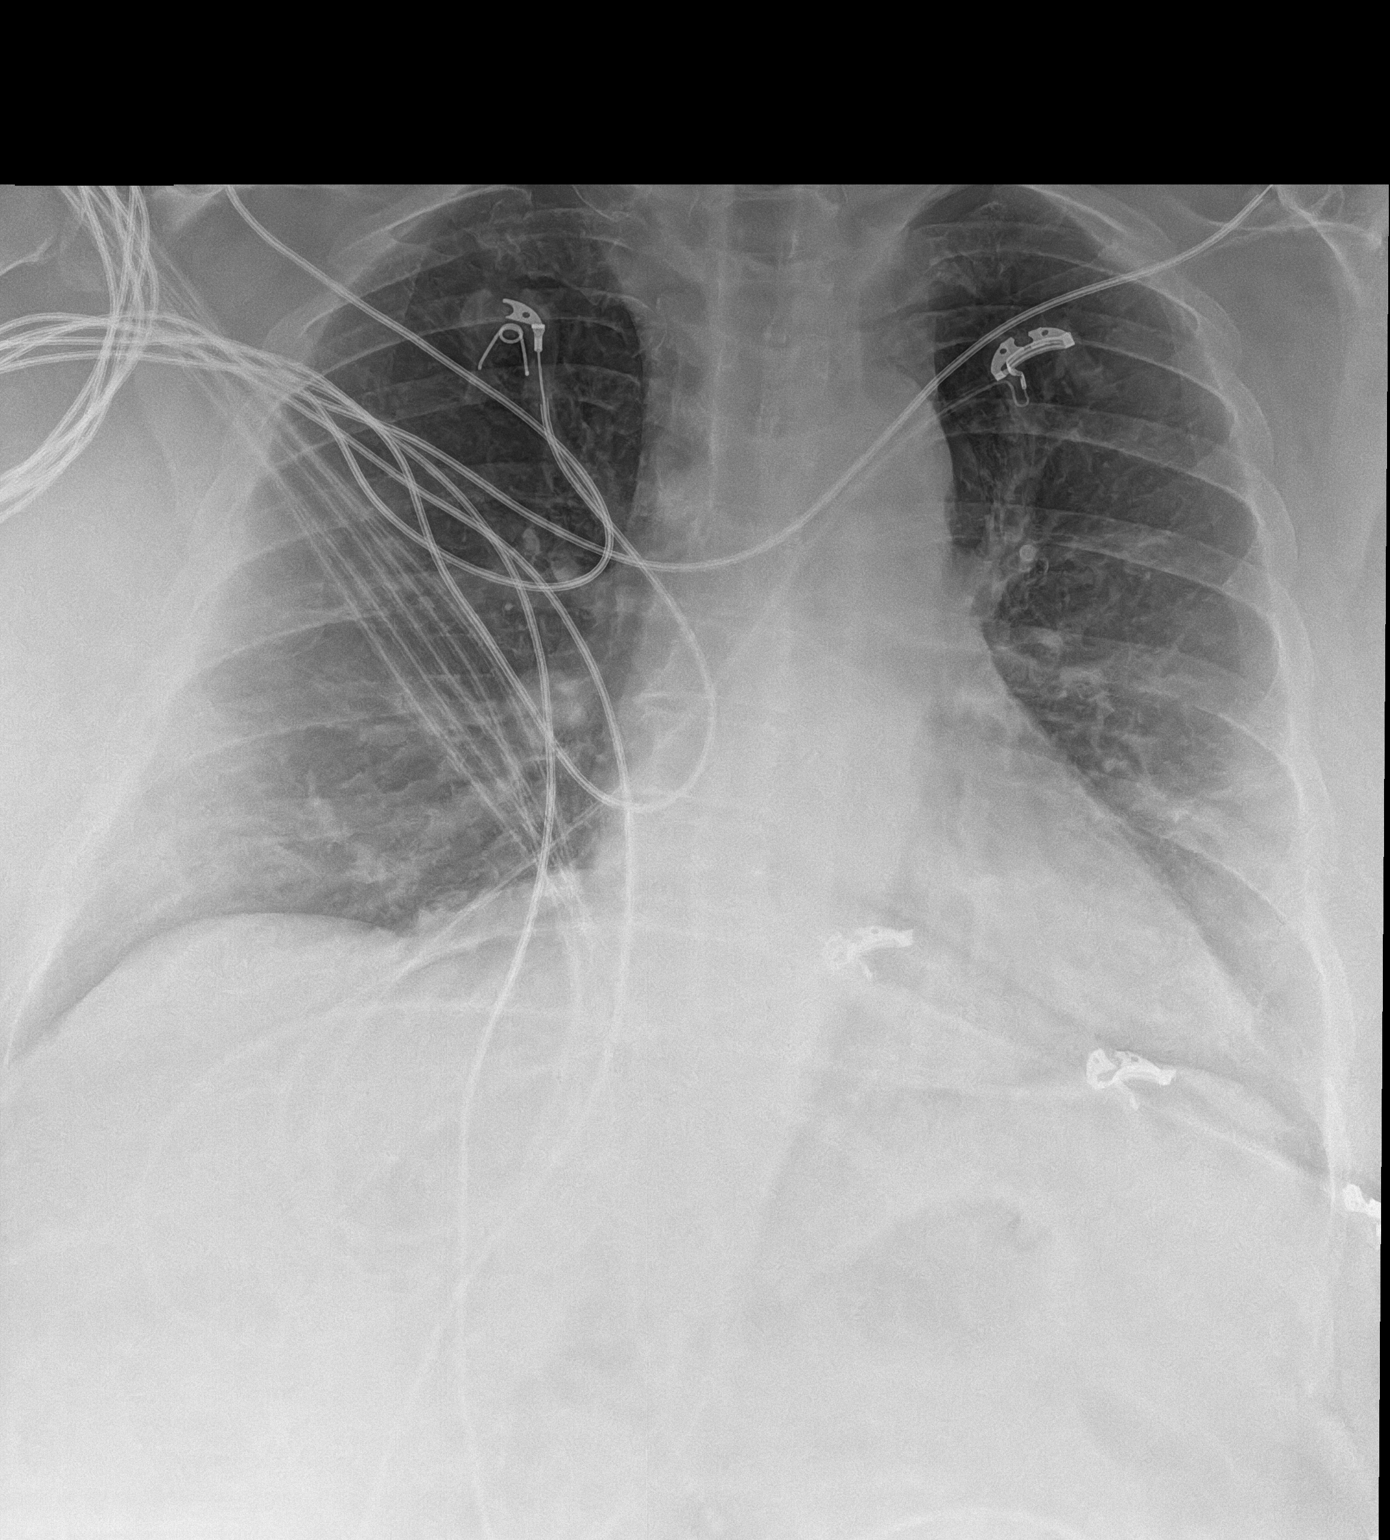

[1 of 1 positions shown; findings below may reference images not displayed]

FINDINGS: Cardiac shadow is within normal limits. The lungs are well aerated
bilaterally. Mild patchy airspace opacity is noted in the bases
bilaterally left greater than right suggestive of early multifocal
pneumonia. This would be consistent with the patient's given
clinical history of prior [id number] positivity. No bony abnormality
is noted. Postsurgical changes in the cervical spine are seen.
IMPRESSION: Patchy bibasilar airspace opacities consistent with the given
clinical history

## 2021-09-21 ENCOUNTER — Other Ambulatory Visit: Payer: Self-pay

## 2021-09-21 ENCOUNTER — Ambulatory Visit (INDEPENDENT_AMBULATORY_CARE_PROVIDER_SITE_OTHER): Payer: BC Managed Care – PPO | Admitting: Internal Medicine

## 2021-09-21 ENCOUNTER — Encounter: Payer: Self-pay | Admitting: Internal Medicine

## 2021-09-21 VITALS — BP 120/72 | HR 67 | Ht 61.0 in | Wt 213.0 lb

## 2021-09-21 DIAGNOSIS — E0590 Thyrotoxicosis, unspecified without thyrotoxic crisis or storm: Secondary | ICD-10-CM

## 2021-09-21 DIAGNOSIS — E042 Nontoxic multinodular goiter: Secondary | ICD-10-CM

## 2021-09-21 LAB — TSH: TSH: 1.65 u[IU]/mL (ref 0.35–5.50)

## 2021-09-21 LAB — T4, FREE: Free T4: 0.66 ng/dL (ref 0.60–1.60)

## 2021-09-21 MED ORDER — METHIMAZOLE 5 MG PO TABS: 2.5000 mg | ORAL_TABLET | Freq: Every day | ORAL | 3 refills | Status: DC

## 2021-09-21 NOTE — Progress Notes (Signed)
Name: Sabrina Mcdonald  MRN/ DOB: [id number], Feb 28, 1956    Age/ Sex: 65 y.o., female     PCP: Allayne Butcher, NP   Reason for Endocrinology Evaluation: Calvin     Initial Endocrinology Clinic Visit: 04/22/2020    PATIENT IDENTIFIER: Sabrina Mcdonald is a 65 y.o., female with a past medical history of bronchiectasis, Asthma , A. Fib   and depression . She has followed with [facility] since 04/22/2020 for consultative assistance with management of her MNG.   HISTORICAL SUMMARY: Pt presented to her PCP with c/o fatigue, brain fogginess, and  feeling achy , was found to have a low TSH at 0.259 uIU/Ml in 03/2020 . This prompted an ultrasound showing MNG with the largest being a left mid central 1.1x1x1.1 cm nodule.   Uptake and scan showed MNG with a 24-hr uptake of I-131 uptake = 16.9% (normal 10-30%)  Since the uptake was not elevated we opted to treat with methimazole due to multiple nonspecific symptoms .Which was started in 04/2020   S/P benign FNA of the left thyroid nodule 05/2020 She is on prednisone for lung disease since 06/2018   Maternal family history with goiter  SUBJECTIVE:    Today (09/21/2021):  Sabrina Mcdonald is here for a follow up on MNG and hyperthyroidism.    Weight has been stable  Continues with Dermatology follow up due to alopecia , minimal improvement.    Denies constipation or diarrhea  Family had a recent stomach bug Was recently diagnosed with a. Fib , on B-blockers and Xarelto  Has occasional  local neck swelling    Methimazole 5 mg half a tablet daily      HISTORY:  Past Medical History:  Past Medical History:  Diagnosis Date   Abnormal EKG    Anxiety    Fatigue    Heat intolerance    Jaw pain    Migraine    Renal disorder    Past Surgical History:  Past Surgical History:  Procedure Laterality Date   APPENDECTOMY     BRAIN SURGERY     CESAREAN SECTION     COLONOSCOPY WITH PROPOFOL N/A 04/14/2020   Procedure: COLONOSCOPY  WITH PROPOFOL;  Surgeon: Daneil Dolin, MD;  [facility];  Service: Endoscopy;  Laterality: N/A;  7:30am   kidnsey stones      laproscopy     Social History:  reports that she has never smoked. She has never used smokeless tobacco. She reports that she does not drink alcohol and does not use drugs. Family History:  Family History  Problem Relation Age of Onset   Colon cancer Neg Hx      HOME MEDICATIONS: Allergies as of 09/21/2021       Reactions   Phenobarbital Itching, Rash        Medication List        Accurate as of September 21, 2021  7:49 AM. If you have any questions, ask your nurse or doctor.          acetaminophen 325 MG tablet Commonly known as: TYLENOL Take 325 mg by mouth daily.   albuterol 108 (90 Base) MCG/ACT inhaler Commonly known as: VENTOLIN HFA Inhale 1-2 puffs into the lungs every 6 (six) hours as needed for wheezing or shortness of breath.   amLODipine 5 MG tablet Commonly known as: NORVASC Take 5 mg by mouth daily.   Brovana 15 MCG/2ML Nebu Generic drug: arformoterol Take 15 mcg by nebulization  daily.   buPROPion 150 MG 24 hr tablet Commonly known as: WELLBUTRIN XL Take 150 mg by mouth daily.   busPIRone 5 MG tablet Commonly known as: BUSPAR Take 5 mg by mouth 2 (two) times daily as needed for anxiety.   clarithromycin 500 MG tablet Commonly known as: BIAXIN Take 500 mg by mouth See admin instructions. Take 1 tablet (500 mg) by mouth twice daily on Mondays, Wednesdays, & Fridays.   Dupixent 300 MG/2ML prefilled syringe Generic drug: dupilumab Inject 300 mg into the skin every 14 (fourteen) days.   escitalopram 20 MG tablet Commonly known as: LEXAPRO Take 20 mg by mouth at bedtime.   fluticasone 50 MCG/ACT nasal spray Commonly known as: FLONASE Place 1 spray into both nostrils daily.   hydrOXYzine 25 MG tablet Commonly known as: ATARAX/VISTARIL Take 25 mg by mouth 3 (three) times daily as needed for anxiety.    ibuprofen 200 MG tablet Commonly known as: ADVIL Take 200 mg by mouth daily.   Klor-Con M20 20 MEQ tablet Generic drug: potassium chloride SA Take 1 tablet (20 mEq total) by mouth 2 (two) times daily for 7 days.   loratadine 10 MG tablet Commonly known as: CLARITIN Take 20 mg by mouth at bedtime.   losartan-hydrochlorothiazide 100-25 MG tablet Commonly known as: HYZAAR Take 1 tablet by mouth daily.   methimazole 5 MG tablet Commonly known as: TAPAZOLE TAKE (1/2) TABLET BY MOUTH DAILY.   methylPREDNISolone 4 MG Tbpk tablet Commonly known as: MEDROL DOSEPAK Use as directed What changed:  how much to take how to take this   montelukast 10 MG tablet Commonly known as: SINGULAIR Take 10 mg by mouth at bedtime.   predniSONE 5 MG tablet Commonly known as: DELTASONE Take 5 mg by mouth 2 (two) times daily with a meal.   Vitamin D (Ergocalciferol) 1.25 MG (50000 UNIT) Caps capsule Commonly known as: DRISDOL Take 50,000 Units by mouth every Sunday.   Xarelto 20 MG Tabs tablet Generic drug: rivaroxaban Take 1 tablet by mouth daily at 6 (six) AM.          OBJECTIVE:   PHYSICAL EXAM: VS: BP 120/72 (BP Location: Left Arm, Patient Position: Sitting, Cuff Size: Small)   Pulse 67   Ht 5\' 1"  (1.549 m)   Wt 213 lb (96.6 kg)   SpO2 96%   BMI 40.25 kg/m    EXAM: General: Pt appears well and is in NAD  Neck: General: Supple without adenopathy. Thyroid: Thyroid size normal.  No goiter or nodules appreciated.  Lungs: Clear with good BS bilat with no rales, rhonchi, or wheezes  Heart: Auscultation: RRR.  Extremities:  1 + edema   Mental Status: Judgment, insight: Intact Orientation: Oriented to time, place, and person Mood and affect: No depression, anxiety, or agitation     DATA REVIEWED:  Latest Reference Range & Units 09/21/21 07:51  TSH 0.35 - 5.50 uIU/mL 1.65  T4,Free(Direct) 0.60 - 1.60 ng/dL 09/23/21    Results for Sabrina Mcdonald, Sabrina Mcdonald (MRN Lauree Chandler) as of  01/08/2021 12:44  Ref. Range 01/07/2021 15:16  Sodium Latest Ref Range: 135 - 145 mEq/L 142  Potassium Latest Ref Range: 3.5 - 5.1 mEq/L 3.4 (L)  Chloride Latest Ref Range: 96 - 112 mEq/L 104  CO2 Latest Ref Range: 19 - 32 mEq/L 28  Glucose Latest Ref Range: 70 - 99 mg/dL 01/09/2021 (H)  BUN Latest Ref Range: 6 - 23 mg/dL 19  Creatinine Latest Ref Range: 0.40 - 1.20 mg/dL 979  Calcium Latest Ref Range: 8.4 - 10.5 mg/dL 10.1  Alkaline Phosphatase Latest Ref Range: 39 - 117 U/L 53  Albumin Latest Ref Range: 3.5 - 5.2 g/dL 4.4  AST Latest Ref Range: 0 - 37 U/L 15  ALT Latest Ref Range: 0 - 35 U/L 22  Total Protein Latest Ref Range: 6.0 - 8.3 g/dL 7.1  Total Bilirubin Latest Ref Range: 0.2 - 1.2 mg/dL 0.5  GFR Latest Ref Range: >60.00 mL/min 93.31  WBC Latest Ref Range: 4.0 - 10.5 K/uL 8.4  RBC Latest Ref Range: 3.87 - 5.11 Mil/uL 4.29  Hemoglobin Latest Ref Range: 12.0 - 15.0 g/dL 12.8  HCT Latest Ref Range: 36.0 - 46.0 % 37.1  MCV Latest Ref Range: 78.0 - 100.0 fl 86.7  MCHC Latest Ref Range: 30.0 - 36.0 g/dL 34.4  RDW Latest Ref Range: 11.5 - 15.5 % 15.5  Platelets Latest Ref Range: 150.0 - 400.0 K/uL 284.0  Neutrophils Latest Ref Range: 43.0 - 77.0 % 80.9 (H)  Lymphocytes Latest Ref Range: 12.0 - 46.0 % 13.1  Monocytes Relative Latest Ref Range: 3.0 - 12.0 % 4.9  Eosinophil Latest Ref Range: 0.0 - 5.0 % 0.5  Basophil Latest Ref Range: 0.0 - 3.0 % 0.6  NEUT# Latest Ref Range: 1.4 - 7.7 K/uL 6.8  Lymphocyte # Latest Ref Range: 0.7 - 4.0 K/uL 1.1  Monocyte # Latest Ref Range: 0.1 - 1.0 K/uL 0.4  Eosinophils Absolute Latest Ref Range: 0.0 - 0.7 K/uL 0.0  Basophils Absolute Latest Ref Range: 0.0 - 0.1 K/uL 0.1  Hemoglobin A1C Latest Ref Range: 4.6 - 6.5 % 6.1   Results for Sabrina Mcdonald, Sabrina Mcdonald (MRN [id number]) as of 05/30/2020 12:56  Ref. Range 04/22/2020 14:33  TRAB Latest Ref Range: <=2.00 IU/L <1.00   Thyroid Ultrasound 04/07/2020 Nodule #1 isthmic,lower central, smooth margins, hyperechoic,  peripheral calcifications, 2.5x1.5x2.6 cm   Nodule#2 Left mid central, smooth margins, heterogenous, no calcifications, 1.1x1x1.1 cm    Scattered nodules otherwise demonstrated within the right and left lobes of thyroid gland with primarily hypoechoic and heterogenous echo texture      Thyroid uptake and scan 05/15/2020  Foci of increased and decreased uptake in both thyroid lobes consistent with multinodular thyroid gland.   No dominant mass.   4 hour I-131 uptake = 6.6% (normal 5-20%)   24 hour I-131 uptake = 16.9% (normal 10-30%)   IMPRESSION: Multinodular thyroid gland.   Normal 4 hour and 24 hour radio iodine uptakes.   ASSESSMENT / PLAN / RECOMMENDATIONS:   Subclinical Hyperthyroidism    - Pt with clinically euthyroid  - Recent diagnosis of A. Fib, discussed importance of euthyroidism  - No local neck symptoms - This is caused by autonomous MNG - TFT's today are normal     Medication   Continue Methimazole 5 mg, HALF a tablet daily       2. Multinodular Goiter:   - No local neck symptoms  - She is S/P FNA of the left thyroid nodule with benign cytology  - I had given her printed ultrasound order in 04/2021 to have it done at [facility] in [place] but this has not been done yet, I have advised her to get this dome another order was printed.      F/U in 6 months    Signed electronically by: Mack Guise, MD  [facility]  [facility] [address] [place], [place] [address] Phone: [phone] FAX: [phone]  CC: Allayne Butcher, NP [place] [id number] Phone: [phone]  Fax: [phone]   Return to Endocrinology clinic as below: No future appointments.

## 2021-09-21 NOTE — Patient Instructions (Signed)
PLease make sure you schedule a thyroid ULtrasound at [facility] in [place]

## 2022-03-24 ENCOUNTER — Ambulatory Visit: Payer: BC Managed Care – PPO | Admitting: Internal Medicine

## 2022-11-16 ENCOUNTER — Other Ambulatory Visit: Payer: Self-pay | Admitting: Internal Medicine

## 2022-11-19 ENCOUNTER — Encounter: Payer: Self-pay | Admitting: Internal Medicine

## 2022-11-19 ENCOUNTER — Ambulatory Visit: Payer: Medicare Other | Admitting: Internal Medicine

## 2022-11-19 VITALS — BP 122/80 | HR 65 | Ht 61.0 in | Wt 209.0 lb

## 2022-11-19 DIAGNOSIS — E042 Nontoxic multinodular goiter: Secondary | ICD-10-CM

## 2022-11-19 DIAGNOSIS — E0590 Thyrotoxicosis, unspecified without thyrotoxic crisis or storm: Secondary | ICD-10-CM

## 2022-11-19 LAB — TSH: TSH: 0.48 u[IU]/mL (ref 0.35–5.50)

## 2022-11-19 LAB — T4, FREE: Free T4: 0.61 ng/dL (ref 0.60–1.60)

## 2022-11-19 MED ORDER — METHIMAZOLE 5 MG PO TABS: 2.5000 mg | ORAL_TABLET | Freq: Every day | ORAL | 3 refills | Status: DC

## 2022-11-19 NOTE — Progress Notes (Signed)
Name: Sabrina Mcdonald  MRN/ DOB: [id number], 1956-06-18    Age/ Sex: 67 y.o., female     PCP: Rema Jasmine, NP   Reason for Endocrinology Evaluation: MNG     Initial Endocrinology Clinic Visit: 04/22/2020    PATIENT IDENTIFIER: Sabrina Mcdonald is a 67 y.o., female with a past medical history of bronchiectasis, Asthma , A. Fib   and depression . She has followed with [facility] since 04/22/2020 for consultative assistance with management of her MNG.   HISTORICAL SUMMARY: Pt presented to her PCP with c/o fatigue, brain fogginess, and  feeling achy , was found to have a low TSH at 0.259 uIU/Ml in 03/2020 . This prompted an ultrasound showing MNG with the largest being a left mid central 1.1x1x1.1 cm nodule.   Uptake and scan showed MNG with a 24-hr uptake of I-131 uptake = 16.9% (normal 10-30%)  Since the uptake was not elevated we opted to treat with methimazole due to multiple nonspecific symptoms .Which was started in 04/2020   S/P benign FNA of the left thyroid nodule 05/2020 She is on prednisone for lung disease since 06/2018   Maternal family history with goiter  SUBJECTIVE:    Today (11/19/2022):  [name] is here for a follow up on MNG and hyperthyroidism.    Weight has been decreasing  She was dx with COVID day after christmas  Denies constipation nor diarrhea  She has occasional palpitations  Denies local neck swelling    Methimazole 5 mg half a tablet daily      HISTORY:  Past Medical History:  Past Medical History:  Diagnosis Date   Abnormal EKG    Anxiety    Fatigue    Heat intolerance    Jaw pain    Migraine    Renal disorder    Past Surgical History:  Past Surgical History:  Procedure Laterality Date   APPENDECTOMY     BRAIN SURGERY     CESAREAN SECTION     COLONOSCOPY WITH PROPOFOL N/A 04/14/2020   Procedure: COLONOSCOPY WITH PROPOFOL;  Surgeon: Corbin Ade, MD;  [facility];  Service: Endoscopy;  Laterality:  N/A;  7:30am   kidnsey stones      laproscopy     Social History:  reports that she has never smoked. She has never used smokeless tobacco. She reports that she does not drink alcohol and does not use drugs. Family History:  Family History  Problem Relation Age of Onset   Colon cancer Neg Hx      HOME MEDICATIONS: Allergies as of 11/19/2022       Reactions   Phenobarbital Itching, Rash        Medication List        Accurate as of November 19, 2022  9:07 AM. If you have any questions, ask your nurse or doctor.          STOP taking these medications    buPROPion 150 MG 24 hr tablet Commonly known as: WELLBUTRIN XL Stopped by: Scarlette Shorts, MD   busPIRone 5 MG tablet Commonly known as: BUSPAR Stopped by: Scarlette Shorts, MD       TAKE these medications    acetaminophen 325 MG tablet Commonly known as: TYLENOL Take 325 mg by mouth daily.   albuterol 108 (90 Base) MCG/ACT inhaler Commonly known as: VENTOLIN HFA Inhale 1-2 puffs into the lungs every 6 (six) hours as needed for wheezing or shortness  of breath.   amLODipine 5 MG tablet Commonly known as: NORVASC Take 5 mg by mouth daily.   Brovana 15 MCG/2ML Nebu Generic drug: arformoterol Take 15 mcg by nebulization daily.   clarithromycin 500 MG tablet Commonly known as: BIAXIN Take 500 mg by mouth See admin instructions. Take 1 tablet (500 mg) by mouth twice daily on Mondays, Wednesdays, & Fridays.   Dupixent 300 MG/2ML prefilled syringe Generic drug: dupilumab Inject 300 mg into the skin every 14 (fourteen) days.   escitalopram 20 MG tablet Commonly known as: LEXAPRO Take 20 mg by mouth at bedtime.   fluticasone 50 MCG/ACT nasal spray Commonly known as: FLONASE Place 1 spray into both nostrils daily.   hydrOXYzine 25 MG tablet Commonly known as: ATARAX Take 25 mg by mouth 3 (three) times daily as needed for anxiety.   ibuprofen 200 MG tablet Commonly known as: ADVIL Take 200  mg by mouth daily.   Klor-Con M20 20 MEQ tablet Generic drug: potassium chloride SA Take 1 tablet (20 mEq total) by mouth 2 (two) times daily for 7 days.   loratadine 10 MG tablet Commonly known as: CLARITIN Take 20 mg by mouth at bedtime.   losartan-hydrochlorothiazide 100-25 MG tablet Commonly known as: HYZAAR Take 1 tablet by mouth daily.   methimazole 5 MG tablet Commonly known as: TAPAZOLE Take 0.5 tablets (2.5 mg total) by mouth daily.   methylPREDNISolone 4 MG Tbpk tablet Commonly known as: MEDROL DOSEPAK Use as directed What changed:  how much to take how to take this   montelukast 10 MG tablet Commonly known as: SINGULAIR Take 10 mg by mouth at bedtime.   predniSONE 5 MG tablet Commonly known as: DELTASONE Take 5 mg by mouth 2 (two) times daily with a meal.   Vitamin D (Ergocalciferol) 1.25 MG (50000 UNIT) Caps capsule Commonly known as: DRISDOL Take 50,000 Units by mouth every Sunday.   Xarelto 20 MG Tabs tablet Generic drug: rivaroxaban Take 1 tablet by mouth daily at 6 (six) AM.          OBJECTIVE:   PHYSICAL EXAM: VS: BP 122/80 (BP Location: Left Arm, Patient Position: Sitting, Cuff Size: Large)   Pulse 65   Ht 5\' 1"  (1.549 m)   Wt 209 lb (94.8 kg)   SpO2 99%   BMI 39.49 kg/m    EXAM: General: Pt appears well and is in NAD  Neck: General: Supple without adenopathy. Thyroid: Thyroid size normal.  No goiter or nodules appreciated.  Lungs: Clear with good BS bilat   Heart: Auscultation: RRR.  Extremities:  Trace  edema   Mental Status:  Orientation: Oriented to time, place, and person Mood and affect: No depression, anxiety, or agitation     DATA REVIEWED:  Latest Reference Range & Units 11/19/22 09:18  TSH 0.35 - 5.50 uIU/mL 0.48  T4,Free(Direct) 0.60 - 1.60 ng/dL 0.61   Results for MARLO, ARRIOLA (MRN [id number]) as of 05/30/2020 12:56  Ref. Range 04/22/2020 14:33  TRAB Latest Ref Range: <=2.00 IU/L <1.00   Thyroid Ultrasound  06/11/2021 Nodule #1 isthmic,lower central, solid or almost completely solid, 2.3 cm (previously 2.5x1.5x2.6 cm)   Nodule#2 Left mid , lower central, 1.1 cm (previously 1.1x1x1.1 cm )   Scattered nodules otherwise demonstrated within the right and left lobes of thyroid gland with primarily hypoechoic and heterogenous echo texture      Thyroid uptake and scan 05/15/2020  Foci of increased and decreased uptake in both thyroid lobes consistent with multinodular  thyroid gland.   No dominant mass.   4 hour I-131 uptake = 6.6% (normal 5-20%)   24 hour I-131 uptake = 16.9% (normal 10-30%)   IMPRESSION: Multinodular thyroid gland.   Normal 4 hour and 24 hour radio iodine uptakes.   ASSESSMENT / PLAN / RECOMMENDATIONS:   Subclinical Hyperthyroidism    - Pt with clinically euthyroid  - This is caused by autonomous MNG - TFT's today are normal     Medication   Continue Methimazole 5 mg, HALF a tablet daily       2. Multinodular Goiter:   - No local neck symptoms  - She is S/P FNA of the left thyroid nodule with benign cytology 05/2020 -Thyroid ultrasound 05/2021 shows stability of the nodules - Will proceed with thyroid ultrasound, she was given an order to take to [place]    F/U in 1 yr    Signed electronically by: Mack Guise, MD  [facility]  [facility] [address] [place], [place] [address] Phone: [phone] FAX: [phone]      CC: Allayne Butcher, NP [place] [address] Phone: [phone]  Fax: [phone]   Return to Endocrinology clinic as below: No future appointments.

## 2022-12-20 ENCOUNTER — Telehealth: Payer: Self-pay | Admitting: Internal Medicine

## 2022-12-20 ENCOUNTER — Encounter: Payer: Self-pay | Admitting: Internal Medicine

## 2022-12-20 NOTE — Telephone Encounter (Signed)
Received thyroid ultrasound results on 12/20/2022.  Health  Texture: Echotexture is heterogeneous.  Flow in the thyroid is normal Total number of nodules >1 cm =2  Location: Isthmus , maximum size 2.9 cm x 1.5x3.0 cm. composition: Solid/almost completely solid, isoechoic, not taller than wide, margins smooth, no echogenic foci  Nodule #2 2.1 cm x 1.2 x 2 cm Isoechoic, not taller than wide, smooth  Impression 1.  Stable multinodular goiter

## 2022-12-23 ENCOUNTER — Telehealth: Payer: Self-pay | Admitting: Internal Medicine

## 2022-12-23 NOTE — Telephone Encounter (Signed)
Patient stopped in and said that she wakes up at night with severe pain in her rectum.  She doesn't know how to describe the pain but it lasts for a while.  She also stated that when she strains or has a bowel movement she sees bright red blood and this is every time she has a BM.  She said she didn't know what to do.

## 2022-12-23 NOTE — Telephone Encounter (Signed)
She hasn't been seen since 2021, she will need an appt.

## 2022-12-27 ENCOUNTER — Ambulatory Visit: Payer: Medicare Other | Admitting: Gastroenterology

## 2022-12-27 ENCOUNTER — Encounter: Payer: Self-pay | Admitting: Gastroenterology

## 2022-12-27 VITALS — BP 128/74 | HR 62 | Temp 97.5°F | Ht 61.0 in | Wt 216.8 lb

## 2022-12-27 DIAGNOSIS — K6289 Other specified diseases of anus and rectum: Secondary | ICD-10-CM

## 2022-12-27 DIAGNOSIS — K625 Hemorrhage of anus and rectum: Secondary | ICD-10-CM | POA: Diagnosis not present

## 2022-12-27 DIAGNOSIS — Z86010 Personal history of colonic polyps: Secondary | ICD-10-CM | POA: Diagnosis not present

## 2022-12-27 NOTE — Patient Instructions (Addendum)
I will call in a compounded cream for you to [facility] by the end of the day today.  You will apply this 2 times per day for 7 to 10 days and then use as needed.  If you continue to have rectal bleeding and rectal pain despite treatment for hemorrhoids we will likely need to schedule a repeat colonoscopy for further evaluation.  Try to avoid long periods of sitting and long periods standing.  Each hour I would recommend you stand for at least 5 minutes before sitting again.  Avoid straining.  Continue limited toilet time to 2-3 minutes at a time.  I will see for follow-up in about 4 weeks, sooner if needed.  Please let hesitate to reach out if you have any concerns.  It was a pleasure to see you today. I want to create trusting relationships with patients. If you receive a survey regarding your visit,  I greatly appreciate you taking time to fill this out on paper or through your MyChart. I value your feedback.  Venetia Night, MSN, FNP-BC, AGACNP-BC [facility]

## 2022-12-27 NOTE — Progress Notes (Signed)
GI Office Note    Referring Provider: Allayne Butcher, NP Primary Care Physician:  Allayne Butcher, NP Primary Gastroenterologist: Cristopher Estimable.Rourk, MD  Date:  12/27/2022  ID:  Sabrina Mcdonald, DOB 1956/06/14, MRN [id number]   Chief Complaint   Chief Complaint  Patient presents with   Rectal Bleeding    Having bleeding from her rectum and sharpe pains.    History of Present Illness  Sabrina Mcdonald is a 67 y.o. female with a history of asthma, anxiety, migraines, appendectomy, bronchiectasis, afib on xarelto, presenting today with complaint of rectal pain and bleeding.  Last office visit in March 2021. Referred to scheduled colonoscopy.  Reportedly doing okay overall.  Having bilateral lower quadrant discomfort.  No history of appendectomy.  Improvement in LLQ pain.  Still has RLQ discomfort.  Currently having constipation and complete bowel movements.  Was on stool softener initially but not recently taking.  Tested negative for COVID recently.  Reported breathing is at baseline.  Denied chest pain, dizziness, lightheadedness, syncope, near syncope.  Patient reported prior colonoscopy with Dr. Britta Mccreedy in [facility] with 5 polyps and recommended 5-year repeat exam.  Prior colonoscopy records requested.  She is scheduled for colonoscopy.  For constipation she was advised to start taking Colace 100 mg daily and add MiraLAX 1 capful daily if needed.  Colonoscopy June 2021: -sigmoid and descending diverticulosis -non bleeding internal hemorrhoids -Repeat colonoscopy in 5 years.     Today: Rectal pain and bleeding - Reports she has a a severe pain that makes her break out in a sweat at night. Does not last long but does last a a while. She repots it is hard to describe but possible sharp and similar to knife like pain. Does not have the sensation to have a BM. Gets up to try and states that nothing helps. Pain comes and goes on its own. Bleeding started about a week ago this past weekend.  Most BM have blood in it. Looks like specks of clots in the stool and states it is more inside the stool rather than on the outside. Denies constipation or diarrhea. Denis heavy lifting, pushing, pulling. Does admit to a lot of sitting during the day. Reports many years ago she had hemorrhoids but none in a very long time. No NSAID use. No alcohol or tobacco use. No vaping. No abdominal pain. No melena.  No dizziness or lightheadedness.  Does not spend long times on the commode. Has a BM in the morning within 30 minutes of being up and does not take her long to go.   Taking prednisone for her lungs (bronchiectasis). Gets short of breath quite often. On empiric antibiotic treatment with clarithromycin for ling infections.    Current Outpatient Medications  Medication Sig Dispense Refill   acetaminophen (TYLENOL) 325 MG tablet Take 325 mg by mouth daily.     albuterol (VENTOLIN HFA) 108 (90 Base) MCG/ACT inhaler Inhale 1-2 puffs into the lungs every 6 (six) hours as needed for wheezing or shortness of breath.     amLODipine (NORVASC) 5 MG tablet Take 5 mg by mouth daily.     BROVANA 15 MCG/2ML NEBU Take 15 mcg by nebulization daily.     clarithromycin (BIAXIN) 500 MG tablet Take 500 mg by mouth See admin instructions. Take 1 tablet (500 mg) by mouth twice daily on Mondays, Wednesdays, & Fridays.     escitalopram (LEXAPRO) 20 MG tablet Take 20 mg by mouth at bedtime.  fluticasone (FLONASE) 50 MCG/ACT nasal spray Place 1 spray into both nostrils daily.      ibuprofen (ADVIL) 200 MG tablet Take 200 mg by mouth daily.     loratadine (CLARITIN) 10 MG tablet Take 20 mg by mouth at bedtime.     losartan-hydrochlorothiazide (HYZAAR) 100-25 MG per tablet Take 1 tablet by mouth daily. 90 tablet 0   methimazole (TAPAZOLE) 5 MG tablet Take 0.5 tablets (2.5 mg total) by mouth daily. 45 tablet 3   methylPREDNISolone (MEDROL DOSEPAK) 4 MG TBPK tablet Use as directed (Patient taking differently: Take 4 mg by  mouth. Use as directed) 21 tablet 0   montelukast (SINGULAIR) 10 MG tablet Take 10 mg by mouth at bedtime.     predniSONE (DELTASONE) 5 MG tablet Take 5 mg by mouth 2 (two) times daily with a meal.     Vitamin D, Ergocalciferol, (DRISDOL) 1.25 MG (50000 UNIT) CAPS capsule Take 50,000 Units by mouth every Sunday.     XARELTO 20 MG TABS tablet Take 1 tablet by mouth daily at 6 (six) AM.     hydrOXYzine (ATARAX/VISTARIL) 25 MG tablet Take 25 mg by mouth 3 (three) times daily as needed for anxiety.     KLOR-CON M20 20 MEQ tablet Take 1 tablet (20 mEq total) by mouth 2 (two) times daily for 7 days. 14 tablet 0   No current facility-administered medications for this visit.    Past Medical History:  Diagnosis Date   Abnormal EKG    Anxiety    Fatigue    Heat intolerance    Jaw pain    Migraine    Renal disorder     Past Surgical History:  Procedure Laterality Date   APPENDECTOMY     BRAIN SURGERY     CESAREAN SECTION     COLONOSCOPY WITH PROPOFOL N/A 04/14/2020   Procedure: COLONOSCOPY WITH PROPOFOL;  Surgeon: Daneil Dolin, MD;  [facility];  Service: Endoscopy;  Laterality: N/A;  7:30am   kidnsey stones      laproscopy      Family History  Problem Relation Age of Onset   Colon cancer Neg Hx     Allergies as of 12/27/2022 - Review Complete 12/27/2022  Allergen Reaction Noted   Phenobarbital Itching and Rash 04/14/2011    Social History   Socioeconomic History   Marital status: Married    Spouse name: Not on file   Number of children: Not on file   Years of education: Not on file   Highest education level: Not on file  Occupational History   Not on file  Tobacco Use   Smoking status: Never   Smokeless tobacco: Never  Vaping Use   Vaping Use: Never used  Substance and Sexual Activity   Alcohol use: No   Drug use: No   Sexual activity: Yes  Other Topics Concern   Not on file  Social History Narrative   Right handed    Lies with husband    Social  Determinants of Health   Financial Resource Strain: Not on file  Food Insecurity: Not on file  Transportation Needs: Not on file  Physical Activity: Not on file  Stress: Not on file  Social Connections: Not on file     Review of Systems   Gen: Denies fever, chills, anorexia. Denies fatigue, weakness, weight loss.  CV: Denies chest pain, palpitations, syncope, peripheral edema, and claudication. Resp: Denies dyspnea at rest, cough, wheezing, coughing up blood, and  pleurisy. GI: See HPI Derm: Denies rash, itching, dry skin Psych: Denies depression, anxiety, memory loss, confusion. No homicidal or suicidal ideation.  Heme: Denies bruising, bleeding, and enlarged lymph nodes.   Physical Exam   BP 128/74 (BP Location: Right Arm, Patient Position: Sitting, Cuff Size: Normal)   Pulse 62   Temp (!) 97.5 F (36.4 C) (Temporal)   Ht '5\' 1"'$  (1.549 m)   Wt 216 lb 12.8 oz (98.3 kg)   SpO2 94%   BMI 40.96 kg/m   General:   Alert and oriented. No distress noted. Pleasant and cooperative.  Head:  Normocephalic and atraumatic. Eyes:  Conjuctiva clear without scleral icterus. Mouth:  Oral mucosa pink and moist. Good dentition. No lesions. Rectal: good rectal tone. No pain on exam. Hemorrhoid tissue noted to right posterior and left lateral positions.  Msk:  Symmetrical without gross deformities. Normal posture. Extremities:  Without edema. Neurologic:  Alert and  oriented x4 Psych:  Alert and cooperative. Normal mood and affect.   Assessment  Sabrina Mcdonald is a 67 y.o. female with a history of asthma, anxiety, migraines, appendectomy presenting today with complaint of rectal pain and bleeding.  Rectal pain and bleeding: Recently has been experiencing severe pain that wakes her up at night to her rectum but also at times may cause her to have diaphoresis.  Symptoms do not typically last very long but although difficult for patient to describe she does say it is usually sharp and similar  to knifelike pain.  Denies sensation to have a bowel movement at this time.  Stated she saw some bright red blood in her stool about 7-10 days ago.  Denies heavy lifting, pushing, pulling but does admit to constant sitting throughout the day.  Denies any constipation or diarrhea.  Denies any NSAID use, alcohol use, tobacco use.  Per her last colonoscopy in 2021 she does have internal hemorrhoids which could be culprit to her rectal bleeding, also mild diverticular bleed is not excluded.  On rectal exam today she has good tone, no pain, no overt BRBPR.  Hemoccult positive.  She does have evidence of hemorrhoids on exam therefore recommended we treat with hemorrhoid cream and if no improvement in pain or rectal bleeding likely need to proceed with early interval colonoscopy.  Hemorrhoid precautions discussed today.  Bleeding also likely increased in the setting of Xarelto.  History of colon polyps: Last colonoscopy in June 2021 with left-sided diverticulosis, hemorrhoids, without polyps and recommended 5-year repeat.  Does have history of polyps on colonoscopy prior to this.  Really due for colonoscopy in 2026 however no rectal bleeding she may need early interval colonoscopy.  PLAN   Compounded hemorrhoid cream from [facility] with lidocaine.  Use twice daily for 7-10 days then as needed Hemorrhoid precautions discussed including avoiding long periods of sitting Colonoscopy due June 2026, may perform early interval pending results of hemorrhoid cream. Follow-up in 4 weeks, sooner if needed.  Will check lab work at that time if needed.    Venetia Night, MSN, FNP-BC, AGACNP-BC [facility]

## 2023-01-24 ENCOUNTER — Encounter: Payer: Self-pay | Admitting: Gastroenterology

## 2023-01-24 ENCOUNTER — Ambulatory Visit: Payer: Medicare Other | Admitting: Gastroenterology

## 2023-02-09 NOTE — Progress Notes (Unsigned)
GI Office Note    Referring Provider: Rema Jasmine, NP Primary Care Physician:  Rema Jasmine, NP Primary Gastroenterologist: Gerrit Friends.Rourk, MD  Date:  02/10/2023  ID:  Sabrina Mcdonald, DOB 09/20/1956, MRN [id number]   Chief Complaint   Chief Complaint  Patient presents with   Follow-up    Follow up on rectal pain that comes and goes   History of Present Illness  Sabrina Mcdonald is a 67 y.o. female with a history of asthma, anxiety, migraines, appendectomy presenting today for follow-up of rectal pain and bleeding.  Last office visit 12/27/22. She presented for evaluation of rectal pain and bleeding.  At times she has pain so severe that she breaks out in a sweat at night but usually does not last long.  Pain is sharp and similar to knifelike pain but hard to describe.  No sensation have a bowel movement.  Most bowel movements with blood.  Also at times suspects of clot described as being more inside the stool.  She denies any overt constipation or diarrhea.  Denies any NSAID use.  Recently taking prednisone for her lungs.  Also on chronic antibiotic treatment with clarithromycin for lung infections.  Given compounded [facility] hemorrhoid cream with lidocaine to use for 7 to 10 days then as needed.  Advised on hemorrhoid precautions.  Advised possible early interval colonoscopy and lab work if no improvement.  Today: Pain does not occur that often but has has improvement in bleeding since her last visit.   She has only had 2 instances of pain. Pain does not last a long time 5-10 minutes as a guess. States if it lasted longer she would want to go to the ER. Gets up and walks around a little to get her mind off of it and then gets better. Usually happens when she sleeps and wakes her up. Usually happens not long after she falls asleep.   No constipation or diarrhea. No overt abdominal pain. Still going to the bathroom first thing in the morning. Not sitting on the commode for long  time. No more clots or blood in the stool.     Current Outpatient Medications  Medication Sig Dispense Refill   acetaminophen (TYLENOL) 325 MG tablet Take 325 mg by mouth daily.     albuterol (VENTOLIN HFA) 108 (90 Base) MCG/ACT inhaler Inhale 1-2 puffs into the lungs every 6 (six) hours as needed for wheezing or shortness of breath.     amLODipine (NORVASC) 5 MG tablet Take 5 mg by mouth daily.     BROVANA 15 MCG/2ML NEBU Take 15 mcg by nebulization daily.     clarithromycin (BIAXIN) 500 MG tablet Take 500 mg by mouth See admin instructions. Take 1 tablet (500 mg) by mouth twice daily on Mondays, Wednesdays, & Fridays.     escitalopram (LEXAPRO) 20 MG tablet Take 20 mg by mouth at bedtime.     fluticasone (FLONASE) 50 MCG/ACT nasal spray Place 1 spray into both nostrils daily.      hydrOXYzine (ATARAX/VISTARIL) 25 MG tablet Take 25 mg by mouth 3 (three) times daily as needed for anxiety.     ibuprofen (ADVIL) 200 MG tablet Take 200 mg by mouth daily.     loratadine (CLARITIN) 10 MG tablet Take 20 mg by mouth at bedtime.     methimazole (TAPAZOLE) 5 MG tablet Take 0.5 tablets (2.5 mg total) by mouth daily. 45 tablet 3   methylPREDNISolone (MEDROL DOSEPAK) 4 MG TBPK tablet Use as  directed (Patient taking differently: Take 4 mg by mouth. Use as directed) 21 tablet 0   montelukast (SINGULAIR) 10 MG tablet Take 10 mg by mouth at bedtime.     predniSONE (DELTASONE) 5 MG tablet Take 5 mg by mouth 2 (two) times daily with a meal.     Vitamin D, Ergocalciferol, (DRISDOL) 1.25 MG (50000 UNIT) CAPS capsule Take 50,000 Units by mouth every Sunday.     XARELTO 20 MG TABS tablet Take 1 tablet by mouth daily at 6 (six) AM.     KLOR-CON M20 20 MEQ tablet Take 1 tablet (20 mEq total) by mouth 2 (two) times daily for 7 days. 14 tablet 0   losartan-hydrochlorothiazide (HYZAAR) 100-25 MG per tablet Take 1 tablet by mouth daily. 90 tablet 0   No current facility-administered medications for this visit.     Past Medical History:  Diagnosis Date   Abnormal EKG    Anxiety    Fatigue    Heat intolerance    Jaw pain    Migraine    Renal disorder     Past Surgical History:  Procedure Laterality Date   APPENDECTOMY     BRAIN SURGERY     CESAREAN SECTION     COLONOSCOPY WITH PROPOFOL N/A 04/14/2020   Procedure: COLONOSCOPY WITH PROPOFOL;  Surgeon: Corbin Ade, MD;  [facility];  Service: Endoscopy;  Laterality: N/A;  7:30am   kidnsey stones      laproscopy      Family History  Problem Relation Age of Onset   Colon cancer Neg Hx     Allergies as of 02/10/2023 - Review Complete 02/10/2023  Allergen Reaction Noted   Phenobarbital Itching and Rash 04/14/2011    Social History   Socioeconomic History   Marital status: Married    Spouse name: Not on file   Number of children: Not on file   Years of education: Not on file   Highest education level: Not on file  Occupational History   Not on file  Tobacco Use   Smoking status: Never   Smokeless tobacco: Never  Vaping Use   Vaping Use: Never used  Substance and Sexual Activity   Alcohol use: No   Drug use: No   Sexual activity: Yes  Other Topics Concern   Not on file  Social History Narrative   Right handed    Lies with husband    Social Determinants of Health   Financial Resource Strain: Not on file  Food Insecurity: Not on file  Transportation Needs: Not on file  Physical Activity: Not on file  Stress: Not on file  Social Connections: Not on file     Review of Systems   Gen: Denies fever, chills, anorexia. Denies fatigue, weakness, weight loss.  CV: Denies chest pain, palpitations, syncope, peripheral edema, and claudication. Resp: Denies dyspnea at rest, cough, wheezing, coughing up blood, and pleurisy. GI: See HPI Derm: Denies rash, itching, dry skin Psych: Denies depression, anxiety, memory loss, confusion. No homicidal or suicidal ideation.  Heme: Denies bruising, bleeding, and  enlarged lymph nodes.   Physical Exam   BP 108/69   Pulse 70   Temp 97.7 F (36.5 C)   Ht  (1.549 m)   Wt 211 lb 6.4 oz (95.9 kg)   BMI 39.94 kg/m   General:   Alert and oriented. No distress noted. Pleasant and cooperative.  Head:  Normocephalic and atraumatic. Eyes:  Conjuctiva clear without scleral icterus. Mouth:  Oral mucosa pink and moist. Good dentition. No lesions. Lungs:  Clear to auscultation bilaterally. No wheezes, rales, or rhonchi. No distress.  Heart:  S1, S2 present without murmurs appreciated.  Abdomen:  +BS, soft, non-tender and non-distended. No rebound or guarding. No HSM or masses noted. Rectal: deferred Msk:  Symmetrical without gross deformities. Normal posture. Extremities:  Without edema. Neurologic:  Alert and  oriented x4 Psych:  Alert and cooperative. Normal mood and affect.   Assessment  Sabrina Mcdonald is a 67 y.o. female with a history of asthma, anxiety, migraines, appendectomy presenting today for follow-up of rectal pain and bleeding.  Rectal pain and bleeding: Has had 2 episodes of nocturnal rectal pain that last 5-10 minutes approximately.  Bleeding has significantly improved since using compounded hemorrhoid cream.  She is usually able to go back to sleep when this happens.  Interestingly does not happen during the day.  Chloride possible anal fissure that was unable to be felt on rectal exam at last visit therefore we will treat with compounded cream for another 1-2 weeks and reassess for any ongoing rectal pain/spasm at follow-up.  Will consider early interval colonoscopy versus biofeedback therapy pending her symptoms.  She reports she is able to deal with the pain that this is going to be very intermittent but just wants to ensure that there is no other significant abnormality going on that could be treated.  May also consider anoscopy.   PLAN   Continue compounded hemorrhoid cream once to twice daily for another 1-2 weeks. Hemorrhoid  precautions Consider anoscopy at next visit.  Follow up in 1 month and if still having issues we will consider colonoscopy versus biofeedback therapy.     Brooke Bonito, MSN, FNP-BC, AGACNP-BC [facility]

## 2023-02-10 ENCOUNTER — Encounter: Payer: Self-pay | Admitting: Gastroenterology

## 2023-02-10 ENCOUNTER — Ambulatory Visit: Payer: Medicare Other | Admitting: Gastroenterology

## 2023-02-10 VITALS — BP 108/69 | HR 70 | Temp 97.7°F | Ht 61.0 in | Wt 211.4 lb

## 2023-02-10 DIAGNOSIS — K6289 Other specified diseases of anus and rectum: Secondary | ICD-10-CM

## 2023-02-10 DIAGNOSIS — K625 Hemorrhage of anus and rectum: Secondary | ICD-10-CM

## 2023-02-10 NOTE — Patient Instructions (Addendum)
Continue compounded cream once to twice daily for another about 2 weeks.   If you have any episodes between now and your follow-up visit you may apply the cream when the pain wakes you up to see if this improves it.  We will follow up in 4 weeks and determine need for colonoscopy vs biofeedback therapy.  It was a pleasure to see you today. I want to create trusting relationships with patients. If you receive a survey regarding your visit,  I greatly appreciate you taking time to fill this out on paper or through your MyChart. I value your feedback.  Brooke Bonito, MSN, FNP-BC, AGACNP-BC [facility]

## 2023-03-13 NOTE — Progress Notes (Unsigned)
GI Office Note    Referring Provider: Rema Jasmine, NP Primary Care Physician:  Sabrina Jasmine, NP Primary Gastroenterologist: Gerrit Friends.Rourk, MD  Date:  03/14/2023  ID:  Sabrina Mcdonald, DOB August 20, 1956, MRN [id number]   Chief Complaint   Chief Complaint  Patient presents with   Follow-up    Follow up on hemorrhoids. Pt's Dr sent a report per pt about her liver they wanted evaluated   History of Present Illness  Sabrina Mcdonald is a 67 y.o. female with a history of asthma, anxiety, migraines, appendectomy  presenting today for follow up of rectal pain and bleeding and to discuss recent CT.  Colonoscopy June 2021: -sigmoid and descending diverticulosis -non bleeding internal hemorrhoids -Repeat colonoscopy in 5 years.   Office visit 12/27/22. She presented for evaluation of rectal pain and bleeding.  At times she has pain so severe that she breaks out in a sweat at night but usually does not last long.  Pain is sharp and similar to knifelike pain but hard to describe.  No sensation have a bowel movement.  Most bowel movements with blood.  Also at times suspects of clot described as being more inside the stool.  She denies any overt constipation or diarrhea.  Denies any NSAID use.  Recently taking prednisone for her lungs.  Also on chronic antibiotic treatment with clarithromycin for lung infections.  Given compounded [facility] hemorrhoid cream with lidocaine to use for 7 to 10 days then as needed.  Advised on hemorrhoid precautions.  Advised possible early interval colonoscopy and lab work if no improvement.  Last office visit 02/10/23.  Improvement of rectal bleeding since prior visit.  Still has pain but does not occur very often. (Only complaint of 2 instances lasting 5-10 minutes at a time) pain usually happens when she sleeps and wakes her up.  Denies constipation, diarrhea.  Advised to continue compounded hemorrhoid cream 1-2 times daily for 1-2 weeks.  Will consider anoscopy at  next visit if still ongoing consider biofeedback versus colonoscopy.  CT A/P 02/02/2023: -Low-attenuation liver reflective of steatosis -They are too small to characterize but likely a cyst -Hepatic granuloma -Gallbladder unremarkable -Normal pancreas and spleen -Subcentimeter nonobstructive left renal stone  Today: Urology had ordered her CT scan to check on her kidney stone. She states the hospital sent her a letter about an abnormal liver findings. She states she called her PCP right away and they also mentioned liver cysts.   Denies pruritus, jaundice, abdominal pain. Has a good appetite, no unintentional weight loss.  Denies any nausea, vomiting, reflux, constipation, diarrhea, night sweats, fevers, chills.  States she has had bronchiectasis and has been on low dose prednisone and clarithromycin. Still is short winded even with walking into the grocery store. Uses the rescue inhale several times daily. Reports she was previously on Dupixent but they quit covering the medication for her.   No rectal bleeding and the rectal pain is better than it was and still has pain that randomly occurs. Has happened about 4 times since her last visit and still wakes her up and lasts for a few minutes until it decides to dissipate. Has occurred a couple of times in the middle of the day. No rectal bleeding.    Current Outpatient Medications  Medication Sig Dispense Refill   acetaminophen (TYLENOL) 325 MG tablet Take 325 mg by mouth daily.     albuterol (VENTOLIN HFA) 108 (90 Base) MCG/ACT inhaler Inhale 1-2 puffs into the lungs every  6 (six) hours as needed for wheezing or shortness of breath.     amLODipine (NORVASC) 5 MG tablet Take 5 mg by mouth daily.     BROVANA 15 MCG/2ML NEBU Take 15 mcg by nebulization daily.     clarithromycin (BIAXIN) 500 MG tablet Take 500 mg by mouth See admin instructions. Take 1 tablet (500 mg) by mouth twice daily on Mondays, Wednesdays, & Fridays.     escitalopram  (LEXAPRO) 20 MG tablet Take 20 mg by mouth at bedtime.     finasteride (PROSCAR) 5 MG tablet Take 2.5 mg by mouth daily.     fluticasone (FLONASE) 50 MCG/ACT nasal spray Place 1 spray into both nostrils daily.      hydrOXYzine (ATARAX/VISTARIL) 25 MG tablet Take 25 mg by mouth 3 (three) times daily as needed for anxiety.     ibuprofen (ADVIL) 200 MG tablet Take 200 mg by mouth daily.     loratadine (CLARITIN) 10 MG tablet Take 20 mg by mouth at bedtime.     methimazole (TAPAZOLE) 5 MG tablet Take 0.5 tablets (2.5 mg total) by mouth daily. 45 tablet 3   montelukast (SINGULAIR) 10 MG tablet Take 10 mg by mouth at bedtime.     predniSONE (DELTASONE) 5 MG tablet Take 5 mg by mouth 2 (two) times daily with a meal.     rosuvastatin (CRESTOR) 20 MG tablet Take 20 mg by mouth at bedtime.     Vitamin D, Ergocalciferol, (DRISDOL) 1.25 MG (50000 UNIT) CAPS capsule Take 50,000 Units by mouth every Sunday.     XARELTO 20 MG TABS tablet Take 1 tablet by mouth daily at 6 (six) AM.     losartan-hydrochlorothiazide (HYZAAR) 100-25 MG per tablet Take 1 tablet by mouth daily. 90 tablet 0   tamsulosin (FLOMAX) 0.4 MG CAPS capsule Take 0.4 mg by mouth daily.     No current facility-administered medications for this visit.    Past Medical History:  Diagnosis Date   Abnormal EKG    Anxiety    Fatigue    Heat intolerance    Jaw pain    Migraine    Renal disorder     Past Surgical History:  Procedure Laterality Date   APPENDECTOMY     BRAIN SURGERY     CESAREAN SECTION     COLONOSCOPY WITH PROPOFOL N/A 04/14/2020   Procedure: COLONOSCOPY WITH PROPOFOL;  Surgeon: Corbin Ade, MD;  [facility]: [facility];  Service: Endoscopy;  Laterality: N/A;  7:30am   kidnsey stones      laproscopy      Family History  Problem Relation Age of Onset   Colon cancer Neg Hx     Allergies as of 03/14/2023 - Review Complete 03/14/2023  Allergen Reaction Noted   Phenobarbital Itching and Rash 04/14/2011     Social History   Socioeconomic History   Marital status: Married    Spouse name: Not on file   Number of children: Not on file   Years of education: Not on file   Highest education level: Not on file  Occupational History   Not on file  Tobacco Use   Smoking status: Never   Smokeless tobacco: Never  Vaping Use   Vaping Use: Never used  Substance and Sexual Activity   Alcohol use: No   Drug use: No   Sexual activity: Yes  Other Topics Concern   Not on file  Social History Narrative   Right handed    Lies  with husband    Social Determinants of Corporate investment banker Strain: Not on file  Food Insecurity: Not on file  Transportation Needs: Not on file  Physical Activity: Not on file  Stress: Not on file  Social Connections: Not on file   Review of Systems   Gen: Denies fever, chills, anorexia. Denies fatigue, weakness, weight loss.  CV: Denies chest pain, palpitations, syncope, peripheral edema, and claudication. Resp: + dyspnea on exertion. Denies dyspnea at rest, cough, wheezing, coughing up blood, and pleurisy. GI: See HPI Derm: Denies rash, itching, dry skin Psych: Denies depression, anxiety, memory loss, confusion. No homicidal or suicidal ideation.  Heme: Denies bruising, bleeding, and enlarged lymph nodes.   Physical Exam   BP 129/80   Pulse 68   Temp 97.6 F (36.4 C)   Ht 5\' 1"  (1.549 m)   Wt 215 lb 9.6 oz (97.8 kg)   BMI 40.74 kg/m   General:   Alert and oriented. No distress noted. Pleasant and cooperative.  Head:  Normocephalic and atraumatic. Eyes:  Conjuctiva clear without scleral icterus. Mouth:  Oral mucosa pink and moist. Good dentition. No lesions. Lungs:  Clear to auscultation bilaterally. No wheezes, rales, or rhonchi. No distress.  Heart:  S1, S2 present without murmurs appreciated.  Abdomen:  +BS, soft, non-tender and non-distended. No rebound or guarding. No HSM or masses noted. Rectal: deferred Msk:  Symmetrical without  gross deformities. Normal posture. Extremities:  Without edema. Neurologic:  Alert and  oriented x4 Psych:  Alert and cooperative. Normal mood and affect.   Assessment  Sabrina Mcdonald is a 67 y.o. female with a history of asthma, anxiety, migraines, appendectomy  presenting today for work up of liver and follow up on rectal bleeding.   Rectal bleeding and proctalgia fugax: Rectal bleeding resolved with compounded hemorrhoid cream.  Continues to have intermittent episodes of rectal pain that is short lasting.  Most commonly occurs at night and wakes her up however has had a couple of occurrences of episodes during the day.  Patient states they remain infrequent. Etiology remains unclear at this time and may have had slight improvement with prior topical antispasmodic.  His symptoms occur more frequently we will consider colonoscopy versus biofeedback therapy as previously discussed.  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 W will check for infectious cause with Quantiferon TB via blood assay and assess for PBC or other autoimmune with labs and check liver enzymes and clotting factors. Will also reassess with an ultrasound of the liver. Etiology unclear at this time but could be secondary to autoimmune or pulmonary disease or possible infection. Not currently on any medications that could contribute.  Too small to characterize liver lesion noted on recent CT A/P.  Given size may be difficult to assess on right upper quadrant ultrasound however we will continue with this plan to also assess for any ascites given hepatic granuloma.  PLAN   RUQ [place] CBC, CMP, AMA, Quantiferon, INR Monitor for worsening frequency or intensity of rectal pain Follow up in 4 months    Brooke Bonito, MSN, FNP-BC, AGACNP-BC [facility]

## 2023-03-14 ENCOUNTER — Ambulatory Visit: Payer: Medicare Other | Admitting: Gastroenterology

## 2023-03-14 ENCOUNTER — Encounter: Payer: Self-pay | Admitting: Gastroenterology

## 2023-03-14 VITALS — BP 129/80 | HR 68 | Temp 97.6°F | Ht 61.0 in | Wt 215.6 lb

## 2023-03-14 DIAGNOSIS — K769 Liver disease, unspecified: Secondary | ICD-10-CM | POA: Diagnosis not present

## 2023-03-14 DIAGNOSIS — K594 Anal spasm: Secondary | ICD-10-CM

## 2023-03-14 DIAGNOSIS — K753 Granulomatous hepatitis, not elsewhere classified: Secondary | ICD-10-CM

## 2023-03-14 DIAGNOSIS — K625 Hemorrhage of anus and rectum: Secondary | ICD-10-CM | POA: Diagnosis not present

## 2023-03-14 NOTE — Patient Instructions (Signed)
Please have blood work completed at [facility].  We will call you with results once they have been received. Please allow 3-5 business days for review. 2 locations for [facility] in [place]:              1. [address], [place]              2. [address] [place]    We are ordering an ultrasound of your liver.  Our scheduler be in contact you with a date and time.  Monitor for any worsening of your rectal pain or increased frequency.  If this occurs please let me know.  We will plan to follow-up in 4 months, sooner if needed.  It was a pleasure to see you today. I want to create trusting relationships with patients. If you receive a survey regarding your visit,  I greatly appreciate you taking time to fill this out on paper or through your MyChart. I value your feedback.  Brooke Bonito, MSN, FNP-BC, AGACNP-BC [facility]

## 2023-03-16 LAB — CBC
Hematocrit: 39 % (ref 34.0–46.6)
Hemoglobin: 13.1 g/dL (ref 11.1–15.9)
MCH: 28.4 pg (ref 26.6–33.0)
MCHC: 33.6 g/dL (ref 31.5–35.7)
MCV: 85 fL (ref 79–97)
Platelets: 263 10*3/uL (ref 150–450)
RBC: 4.61 x10E6/uL (ref 3.77–5.28)
RDW: 14.4 % (ref 11.7–15.4)
WBC: 6.8 10*3/uL (ref 3.4–10.8)

## 2023-03-16 LAB — COMPREHENSIVE METABOLIC PANEL
ALT: 17 IU/L (ref 0–32)
AST: 17 IU/L (ref 0–40)
Albumin/Globulin Ratio: 2.1 (ref 1.2–2.2)
Albumin: 4.5 g/dL (ref 3.9–4.9)
Alkaline Phosphatase: 98 IU/L (ref 44–121)
BUN/Creatinine Ratio: 15 (ref 12–28)
BUN: 9 mg/dL (ref 8–27)
Bilirubin Total: 0.3 mg/dL (ref 0.0–1.2)
CO2: 24 mmol/L (ref 20–29)
Calcium: 10.1 mg/dL (ref 8.7–10.3)
Chloride: 104 mmol/L (ref 96–106)
Creatinine, Ser: 0.6 mg/dL (ref 0.57–1.00)
Globulin, Total: 2.1 g/dL (ref 1.5–4.5)
Glucose: 91 mg/dL (ref 70–99)
Potassium: 3.4 mmol/L — ABNORMAL LOW (ref 3.5–5.2)
Sodium: 140 mmol/L (ref 134–144)
Total Protein: 6.6 g/dL (ref 6.0–8.5)
eGFR: 99 mL/min/{1.73_m2} (ref >59)

## 2023-03-16 LAB — MITOCHONDRIAL ANTIBODIES: Mitochondrial Ab: <20 Units (ref 0.0–20.0)

## 2023-03-16 LAB — PROTIME-INR
INR: 1.1 (ref 0.9–1.2)
Prothrombin Time: 11.5 s (ref 9.1–12.0)

## 2023-03-16 LAB — QUANTIFERON-TB GOLD PLUS
QuantiFERON Mitogen Value: >10 IU/mL
QuantiFERON Nil Value: 0.02 IU/mL
QuantiFERON TB1 Ag Value: 0.05 IU/mL
QuantiFERON TB2 Ag Value: 0.02 IU/mL
QuantiFERON-TB Gold Plus: NEGATIVE

## 2023-03-31 ENCOUNTER — Ambulatory Visit (HOSPITAL_COMMUNITY)
Admission: RE | Admit: 2023-03-31 | Discharge: 2023-03-31 | Disposition: A | Discharge status: Home / Self Care | Payer: Medicare Other | Source: Ambulatory Visit | Attending: Gastroenterology | Admitting: Gastroenterology

## 2023-03-31 DIAGNOSIS — K753 Granulomatous hepatitis, not elsewhere classified: Secondary | ICD-10-CM | POA: Diagnosis present

## 2023-03-31 DIAGNOSIS — K769 Liver disease, unspecified: Secondary | ICD-10-CM | POA: Insufficient documentation

## 2023-06-20 ENCOUNTER — Encounter: Payer: Self-pay | Admitting: Gastroenterology

## 2023-07-27 NOTE — Progress Notes (Unsigned)
GI Office Note    Referring Provider: Rema Jasmine, NP Primary Care Physician:  Rema Jasmine, NP Primary Gastroenterologist: Gerrit Friends.Rourk, MD  Date:  07/28/2023  ID:  Sabrina Mcdonald, DOB 02/28/1956, MRN [id number]   Chief Complaint   Chief Complaint  Patient presents with   Follow-up    Follow up. No problems    History of Present Illness  Sabrina Mcdonald is a 67 y.o. female with a history of hepatic granuloma, anxiety, asthma, migraines, and previous appendectomy presenting today for follow-up.   Colonoscopy June 2021: -sigmoid and descending diverticulosis -non bleeding internal hemorrhoids -Repeat colonoscopy in 5 years.    Office visit 02/10/23.  Improvement of rectal bleeding since prior visit.  Still has pain but does not occur very often. (Only complaint of 2 instances lasting 5-10 minutes at a time) pain usually happens when she sleeps and wakes her up.  Denies constipation, diarrhea.  Advised to continue compounded hemorrhoid cream 1-2 times daily for 1-2 weeks.  Will consider anoscopy at next visit if still ongoing consider biofeedback versus colonoscopy.   CT A/P 02/02/2023: -Low-attenuation liver reflective of steatosis -They are too small to characterize but likely a cyst -Hepatic granuloma -Gallbladder unremarkable -Normal pancreas and spleen -Subcentimeter nonobstructive left renal stone  Last office visit 03/14/23.  Following up on some abnormal liver findings from recent CT scan of her kidney.  No alarm symptoms present in regards to granuloma.  Continued on low-dose prednisone and clarithromycin for bronchiectasis and still with some intermittent dyspnea on exertion.  Denied any rectal bleeding and rectal pain is improved.  Had about 4 instances since her last visit. RUQ [place], labs. Monitor for worsening rectal pain. F/u 4 months.  Labs 03/14/23: Negative TB and AMA. Normal CBC, CMP, INR.   RUQ [place] 03/31/23: -CBD 5 mm -No focal liver lesion -Hepatic  steatosis -Small calcified granuloma measuring 5 mm in her left hepatic lobe -Patent portal vein -5 mm stone within the right kidney  Today: Has found that even tea can help bloat her. Has found that if she drinks water only she is not as bloated or constipation. If she drinks tea or soda she will have some of those symptoms.  Has coffee in the mornings and drinks water in the morning and goes first thing before her coffee in the mornings.   Rectal spasms have been resolved for the most part.   Current Outpatient Medications  Medication Sig Dispense Refill   acetaminophen (TYLENOL) 325 MG tablet Take 325 mg by mouth daily.     albuterol (VENTOLIN HFA) 108 (90 Base) MCG/ACT inhaler Inhale 1-2 puffs into the lungs every 6 (six) hours as needed for wheezing or shortness of breath.     amLODipine (NORVASC) 5 MG tablet Take 5 mg by mouth daily.     BROVANA 15 MCG/2ML NEBU Take 15 mcg by nebulization daily.     buPROPion (WELLBUTRIN XL) 150 MG 24 hr tablet Take 150 mg by mouth daily.     clarithromycin (BIAXIN) 500 MG tablet Take 500 mg by mouth See admin instructions. Take 1 tablet (500 mg) by mouth twice daily on Mondays, Wednesdays, & Fridays.     finasteride (PROSCAR) 5 MG tablet Take 2.5 mg by mouth daily.     fluticasone (FLONASE) 50 MCG/ACT nasal spray Place 1 spray into both nostrils daily.      hydrOXYzine (ATARAX/VISTARIL) 25 MG tablet Take 25 mg by mouth 3 (three) times daily as needed for anxiety.  ibuprofen (ADVIL) 200 MG tablet Take 200 mg by mouth daily.     loratadine (CLARITIN) 10 MG tablet Take 20 mg by mouth at bedtime.     losartan-hydrochlorothiazide (HYZAAR) 100-25 MG per tablet Take 1 tablet by mouth daily. 90 tablet 0   methimazole (TAPAZOLE) 5 MG tablet Take 0.5 tablets (2.5 mg total) by mouth daily. 45 tablet 3   montelukast (SINGULAIR) 10 MG tablet Take 10 mg by mouth at bedtime.     predniSONE (DELTASONE) 5 MG tablet Take 5 mg by mouth 2 (two) times daily with a  meal.     rosuvastatin (CRESTOR) 20 MG tablet Take 20 mg by mouth at bedtime.     Vitamin D, Ergocalciferol, (DRISDOL) 1.25 MG (50000 UNIT) CAPS capsule Take 50,000 Units by mouth every Sunday.     XARELTO 20 MG TABS tablet Take 1 tablet by mouth daily at 6 (six) AM.     escitalopram (LEXAPRO) 20 MG tablet Take 20 mg by mouth at bedtime. (Patient not taking: Reported on 07/28/2023)     tamsulosin (FLOMAX) 0.4 MG CAPS capsule Take 0.4 mg by mouth daily. (Patient not taking: Reported on 07/28/2023)     No current facility-administered medications for this visit.    Past Medical History:  Diagnosis Date   Abnormal EKG    Anxiety    Fatigue    Heat intolerance    Jaw pain    Migraine    Renal disorder     Past Surgical History:  Procedure Laterality Date   APPENDECTOMY     BRAIN SURGERY     CESAREAN SECTION     COLONOSCOPY WITH PROPOFOL N/A 04/14/2020   Procedure: COLONOSCOPY WITH PROPOFOL;  Surgeon: Corbin Ade, MD;  [facility];  Service: Endoscopy;  Laterality: N/A;  7:30am   kidnsey stones      laproscopy      Family History  Problem Relation Age of Onset   Colon cancer Neg Hx     Allergies as of 07/28/2023 - Review Complete 03/14/2023  Allergen Reaction Noted   Phenobarbital Itching and Rash 04/14/2011    Social History   Socioeconomic History   Marital status: Married    Spouse name: Not on file   Number of children: Not on file   Years of education: Not on file   Highest education level: Not on file  Occupational History   Not on file  Tobacco Use   Smoking status: Never   Smokeless tobacco: Never  Vaping Use   Vaping status: Never Used  Substance and Sexual Activity   Alcohol use: No   Drug use: No   Sexual activity: Yes  Other Topics Concern   Not on file  Social History Narrative   Right handed    Lies with husband    Social Determinants of Health   Financial Resource Strain: Not on file  Food Insecurity: Not on file   Transportation Needs: Not on file  Physical Activity: Not on file  Stress: Not on file  Social Connections: Not on file     Review of Systems   Gen: Denies fever, chills, anorexia. Denies fatigue, weakness, weight loss.  CV: Denies chest pain, palpitations, syncope, peripheral edema, and claudication. Resp: Denies dyspnea at rest, cough, wheezing, coughing up blood, and pleurisy. GI: See HPI Derm: Denies rash, itching, dry skin Psych: Denies depression, anxiety, memory loss, confusion. No homicidal or suicidal ideation.  Heme: Denies bruising, bleeding, and enlarged lymph nodes.  Physical Exam   BP 118/68 (BP Location: Right Arm, Patient Position: Sitting, Cuff Size: Large)   Pulse 71   Temp 97.7 F (36.5 C) (Temporal)   Ht 5\' 1"  (1.549 m)   Wt 213 lb 12.8 oz (97 kg)   SpO2 94%   BMI 40.40 kg/m   General:   Alert and oriented. No distress noted. Pleasant and cooperative.  Head:  Normocephalic and atraumatic. Eyes:  Conjuctiva clear without scleral icterus. Mouth:  Oral mucosa pink and moist. Good dentition. No lesions. Lungs:  Clear to auscultation bilaterally. No wheezes, rales, or rhonchi. No distress.  Heart:  S1, S2 present without murmurs appreciated.  Abdomen:  +BS, soft, non-tender and non-distended. No rebound or guarding. No HSM or masses noted. Rectal: deferred Msk:  Symmetrical without gross deformities. Normal posture. Extremities:  Without edema. Neurologic:  Alert and  oriented x4 Psych:  Alert and cooperative. Normal mood and affect.   Assessment  Sabrina Mcdonald is a 67 y.o. female with a history of hepatic granuloma, anxiety, asthma, migraines, and previous appendectomy presenting today for follow-up.   Proctalgia fugax: Recently has not had any issues with rectal spasms.  Also not having any rectal bleeding.  This is likely somewhat better controlled given she is not having much bloating or constipation anymore as she is primarily drinking water.    Hepatic granuloma: Noted on prior CT scan which was performed to assess for kidney stones.  Continues to be asymptomatic.  Occasionally has some twinges of right upper quadrant discomfort but no overt pain.  Workup thus far negative for infectious etiology.  AMA negative.  LFTs have been within normal limits.  Could be secondary to her pulmonary disease given she has bronchiectasis.  Advised her to continue to monitor for any signs of liver disease including pruritus, jaundice, worsening abdominal pain, weight loss, or lack of appetite.  Recommended to monitor liver enzymes every 6 months and could pursue additional evaluation if she has a rise in LFTs.  She plans to have blood work performed with primary care provider.  Discussed that she is not due for repeat colonoscopy for surveillance purposes until 2026.  Unless she were to develop any rectal bleeding or any worsening rectal pain then we could consider early interval endoscopic evaluation.  PLAN    CMP every 6 months Monitor for pruritus, jaundice, worsening abdominal pain, lack of appetite. Repeat colonoscopy due 2026 Follow-up in 1 year, sooner if needed    Brooke Bonito, MSN, FNP-BC, AGACNP-BC [facility]

## 2023-07-28 ENCOUNTER — Encounter: Payer: Self-pay | Admitting: Gastroenterology

## 2023-07-28 ENCOUNTER — Ambulatory Visit: Payer: Medicare Other | Admitting: Gastroenterology

## 2023-07-28 VITALS — BP 118/68 | HR 71 | Temp 97.7°F | Ht 61.0 in | Wt 213.8 lb

## 2023-07-28 DIAGNOSIS — K753 Granulomatous hepatitis, not elsewhere classified: Secondary | ICD-10-CM

## 2023-07-28 DIAGNOSIS — Z860100 Personal history of colon polyps, unspecified: Secondary | ICD-10-CM

## 2023-07-28 DIAGNOSIS — K6289 Other specified diseases of anus and rectum: Secondary | ICD-10-CM

## 2023-07-28 NOTE — Patient Instructions (Addendum)
It was a pleasure to see you today!  Continue to monitor for any worsening rectal pain.  If you have blood work every 6 months with your primary care provider please let them know to check your liver enzymes given your history of this hepatic granuloma.  Monitor for excessive itching, jaundice, abdominal pain, lack of appetite, and unintentional weight loss.  We will plan to follow-up in 1 year, sooner if needed!  Happy Holidays to you!  I want to create trusting relationships with patients. If you receive a survey regarding your visit,  I greatly appreciate you taking time to fill this out on paper or through your MyChart. I value your feedback.  Brooke Bonito, MSN, FNP-BC, AGACNP-BC [facility]

## 2023-08-22 ENCOUNTER — Other Ambulatory Visit (INDEPENDENT_AMBULATORY_CARE_PROVIDER_SITE_OTHER): Payer: Medicare Other

## 2023-08-22 ENCOUNTER — Encounter: Payer: Self-pay | Admitting: Orthopedic Surgery

## 2023-08-22 ENCOUNTER — Ambulatory Visit: Payer: Medicare Other | Admitting: Orthopedic Surgery

## 2023-08-22 VITALS — BP 127/76 | HR 80 | Ht 61.0 in | Wt 216.0 lb

## 2023-08-22 DIAGNOSIS — G250 Essential tremor: Secondary | ICD-10-CM | POA: Insufficient documentation

## 2023-08-22 DIAGNOSIS — J4550 Severe persistent asthma, uncomplicated: Secondary | ICD-10-CM | POA: Insufficient documentation

## 2023-08-22 DIAGNOSIS — M25562 Pain in left knee: Secondary | ICD-10-CM | POA: Diagnosis not present

## 2023-08-22 DIAGNOSIS — J9801 Acute bronchospasm: Secondary | ICD-10-CM | POA: Insufficient documentation

## 2023-08-22 DIAGNOSIS — M1712 Unilateral primary osteoarthritis, left knee: Secondary | ICD-10-CM | POA: Diagnosis not present

## 2023-08-22 DIAGNOSIS — M7052 Other bursitis of knee, left knee: Secondary | ICD-10-CM | POA: Diagnosis not present

## 2023-08-22 DIAGNOSIS — E669 Obesity, unspecified: Secondary | ICD-10-CM | POA: Insufficient documentation

## 2023-08-22 DIAGNOSIS — E876 Hypokalemia: Secondary | ICD-10-CM | POA: Insufficient documentation

## 2023-08-22 DIAGNOSIS — G479 Sleep disorder, unspecified: Secondary | ICD-10-CM | POA: Insufficient documentation

## 2023-08-22 DIAGNOSIS — J321 Chronic frontal sinusitis: Secondary | ICD-10-CM | POA: Insufficient documentation

## 2023-08-22 DIAGNOSIS — F418 Other specified anxiety disorders: Secondary | ICD-10-CM | POA: Insufficient documentation

## 2023-08-22 DIAGNOSIS — R0600 Dyspnea, unspecified: Secondary | ICD-10-CM | POA: Insufficient documentation

## 2023-08-22 DIAGNOSIS — I10 Essential (primary) hypertension: Secondary | ICD-10-CM | POA: Insufficient documentation

## 2023-08-22 DIAGNOSIS — R059 Cough, unspecified: Secondary | ICD-10-CM | POA: Insufficient documentation

## 2023-08-22 DIAGNOSIS — G8929 Other chronic pain: Secondary | ICD-10-CM

## 2023-08-22 DIAGNOSIS — J322 Chronic ethmoidal sinusitis: Secondary | ICD-10-CM | POA: Insufficient documentation

## 2023-08-22 DIAGNOSIS — J418 Mixed simple and mucopurulent chronic bronchitis: Secondary | ICD-10-CM | POA: Insufficient documentation

## 2023-08-22 DIAGNOSIS — F458 Other somatoform disorders: Secondary | ICD-10-CM | POA: Insufficient documentation

## 2023-08-22 DIAGNOSIS — J324 Chronic pansinusitis: Secondary | ICD-10-CM | POA: Insufficient documentation

## 2023-08-22 DIAGNOSIS — J029 Acute pharyngitis, unspecified: Secondary | ICD-10-CM | POA: Insufficient documentation

## 2023-08-22 MED ORDER — PREDNISONE 10 MG PO TABS
10.0000 mg | ORAL_TABLET | Freq: Three times a day (TID) | ORAL | 0 refills | Status: DC
Start: 2023-08-22 — End: 2023-11-25

## 2023-08-22 MED ORDER — METHYLPREDNISOLONE ACETATE 40 MG/ML IJ SUSP
40.0000 mg | Freq: Once | INTRAMUSCULAR | Status: AC
  Administered 2023-08-22: 40 mg via INTRA_ARTICULAR

## 2023-08-22 NOTE — Patient Instructions (Signed)
Ice the knee for 20 minutes 3 times a day  Apply Bengay 3 times a day  Change the prednisone from 5 mg to 10 mg take that as prescribed  Add Tylenol 500 mg every 6 hours

## 2023-08-22 NOTE — Addendum Note (Signed)
Addended byCaffie Damme on: 08/22/2023 10:48 AM   Modules accepted: Orders

## 2023-08-22 NOTE — Progress Notes (Signed)
Office Visit Note   Patient: Sabrina Mcdonald           Date of Birth: [birth year]           MRN: [id number] Visit Date: 08/22/2023 Requested by: Rema Jasmine, NP [address] [place],  [place] [address] PCP: Rema Jasmine, NP   Assessment & Plan:   Encounter Diagnoses  Name Primary?   Acute pain of left knee    Pes anserinus bursitis of left knee Yes    Meds ordered this encounter  Medications   predniSONE (DELTASONE) 10 MG tablet    Sig: Take 1 tablet (10 mg total) by mouth 3 (three) times daily.    Dispense:  42 tablet    Refill:  0    ICE TYLENOL 500 Q6 HRS BEN GAY  INJECTION PREDNISONE   Procedure note Left knee injection for bursitis   verbal consent was obtained to inject Left knee PES BURSA  Timeout was completed to confirm the site of injection  The medications used were 40 mg of Depo-Medrol and 1% lidocaine 3 cc  Anesthesia was provided by ethyl chloride and the skin was prepped with alcohol.  After cleaning the skin with alcohol a 25-gauge needle was used to inject the left knee bursa   There were no complications and a sterile bandage was applied    Subjective: Chief Complaint  Patient presents with   Knee Pain    Work in today for left knee pain for about a month worse recently     HPI: 67 year old female same-day appointment acute pain medial side left knee no history of trauma.  Pain is exacerbated by bending and straightening the leg.  Denies any recent increase in activity.              ROS: Shortness of breath    Visit Diagnoses:  1. Pes anserinus bursitis of left knee   2. Acute pain of left knee      Follow-Up Instructions: Return in about 4 weeks (around 09/19/2023) for FOLLOW UP, LEFT, KNEE PES BURSITIS.    Objective: Vital Signs: BP 127/76   Pulse 80   Ht 5\' 1"  (1.549 m)   Wt 216 lb (98 kg)   BMI 40.81 kg/m   Physical Exam Vitals and nursing note reviewed.  Constitutional:      Appearance: Normal appearance.   HENT:     Head: Normocephalic and atraumatic.  Eyes:     General: No scleral icterus.       Right eye: No discharge.        Left eye: No discharge.     Extraocular Movements: Extraocular movements intact.     Conjunctiva/sclera: Conjunctivae normal.     Pupils: Pupils are equal, round, and reactive to light.  Cardiovascular:     Rate and Rhythm: Normal rate.     Pulses: Normal pulses.  Musculoskeletal:     Left knee: No effusion.     Instability Tests: Medial McMurray test negative.  Skin:    General: Skin is warm and dry.     Capillary Refill: Capillary refill takes less than 2 seconds.  Neurological:     General: No focal deficit present.     Mental Status: She is alert and oriented to person, place, and time.     Gait: Gait abnormal.  Psychiatric:        Mood and Affect: Mood normal.        Behavior: Behavior normal.  Thought Content: Thought content normal.        Judgment: Judgment normal.      Left Knee Exam   Tenderness  The patient is experiencing tenderness in the pes anserinus.  Range of Motion  Extension:  normal  Flexion:  normal   Tests  McMurray:  Medial - negative  Varus: negative Valgus: negative Drawer:  Anterior - negative     Posterior - negative  Other  Erythema: absent Scars: absent Sensation: normal Pulse: present Swelling: moderate Effusion: no effusion present  Comments:  The swelling is over the pes tendons and PES BURSA       Specialty Comments:  No specialty comments available.  Imaging: DG Knee AP/LAT W/Sunrise Left  Result Date: 08/22/2023 Knee images Left knee Reason-pain medial side left knee no trauma X-rays show continued valgus physiologic alignment mild narrowing of the medial compartment mild sclerosis of the subchondral bone without osteophyte formation.  Mild patellofemoral degenerative changes. Impression osteoarthritis left knee mild     PMFS History: Patient Active Problem List   Diagnosis Date  Noted   Obesity with body mass index 30 or greater 08/22/2023   Chronic pansinusitis 08/22/2023   Chronic frontal sinusitis 08/22/2023   Chronic ethmoidal sinusitis 08/22/2023   Bruxism 08/22/2023   Bronchospasm 08/22/2023   Disturbance in sleep behavior 08/22/2023   Cough 08/22/2023   Dyspnea 08/22/2023   Essential hypertension 08/22/2023   Hypokalemia 08/22/2023   Hereditary essential tremor 08/22/2023   Mixed anxiety and depressive disorder 08/22/2023   Mixed simple and mucopurulent chronic bronchitis (HCC) 08/22/2023   Pharyngitis 08/22/2023   Uncomplicated severe persistent asthma 08/22/2023   Hyperthyroidism 04/22/2020   Multinodular goiter 04/22/2020   Constipation 01/15/2020   History of colonic polyps 01/15/2020   S/P laparoscopic appendectomy 01/04/2020   Migraine    Anxiety    Fatigue    Jaw pain    Abnormal EKG    Heat intolerance    Past Medical History:  Diagnosis Date   Abnormal EKG    Anxiety    Fatigue    Heat intolerance    Jaw pain    Migraine    Renal disorder     Family History  Problem Relation Age of Onset   Colon cancer Neg Hx     Past Surgical History:  Procedure Laterality Date   APPENDECTOMY     BRAIN SURGERY     CESAREAN SECTION     COLONOSCOPY WITH PROPOFOL N/A 04/14/2020   Procedure: COLONOSCOPY WITH PROPOFOL;  Surgeon: Corbin Ade, MD;  [facility];  Service: Endoscopy;  Laterality: N/A;  7:30am   kidnsey stones      laproscopy     Social History   Occupational History   Not on file  Tobacco Use   Smoking status: Never   Smokeless tobacco: Never  Vaping Use   Vaping status: Never Used  Substance and Sexual Activity   Alcohol use: No   Drug use: No   Sexual activity: Yes

## 2023-09-29 ENCOUNTER — Ambulatory Visit: Payer: Medicare Other | Admitting: Orthopedic Surgery

## 2023-11-03 ENCOUNTER — Other Ambulatory Visit: Payer: Self-pay | Admitting: Internal Medicine

## 2023-11-21 ENCOUNTER — Ambulatory Visit: Payer: Medicare Other | Admitting: Internal Medicine

## 2023-11-21 NOTE — Progress Notes (Deleted)
Name: Sabrina Mcdonald  MRN/ DOB: [id number], [birth year]    Age/ Sex: 68 y.o., female     PCP: Rema Jasmine, NP   Reason for Endocrinology Evaluation: MNG     Initial Endocrinology Clinic Visit: 04/22/2020    PATIENT IDENTIFIER: Sabrina Mcdonald is a 68 y.o., female with a past medical history of bronchiectasis, Asthma , A. Fib   and depression . She has followed with [facility] since 04/22/2020 for consultative assistance with management of her MNG.   HISTORICAL SUMMARY: Pt presented to her PCP with c/o fatigue, brain fogginess, and  feeling achy , was found to have a low TSH at 0.259 uIU/Ml in 03/2020 . This prompted an ultrasound showing MNG with the largest being a left mid central 1.1x1x1.1 cm nodule.   Uptake and scan showed MNG with a 24-hr uptake of I-131 uptake = 16.9% (normal 10-30%)  Since the uptake was not elevated we opted to treat with methimazole due to multiple nonspecific symptoms .Which was started in 04/2020   S/P benign FNA of the left thyroid nodule 05/2020 She is on prednisone for lung disease since 06/2018   Maternal family history with goiter  SUBJECTIVE:    Today (11/21/2023):  [name] is here for a follow up on MNG and hyperthyroidism.   The patient follows with GI for hepatic granuloma Weight Denies constipation nor diarrhea  She has occasional palpitations  Denies local neck swelling    Methimazole 5 mg half a tablet daily      HISTORY:  Past Medical History:  Past Medical History:  Diagnosis Date   Abnormal EKG    Anxiety    Fatigue    Heat intolerance    Jaw pain    Migraine    Renal disorder    Past Surgical History:  Past Surgical History:  Procedure Laterality Date   APPENDECTOMY     BRAIN SURGERY     CESAREAN SECTION     COLONOSCOPY WITH PROPOFOL N/A 04/14/2020   Procedure: COLONOSCOPY WITH PROPOFOL;  Surgeon: Corbin Ade, MD;  [facility];  Service: Endoscopy;  Laterality: N/A;  7:30am    kidnsey stones      laproscopy     Social History:  reports that she has never smoked. She has never used smokeless tobacco. She reports that she does not drink alcohol and does not use drugs. Family History:  Family History  Problem Relation Age of Onset   Colon cancer Neg Hx      HOME MEDICATIONS: Allergies as of 11/21/2023       Reactions   Phenobarbital Itching, Rash        Medication List        Accurate as of November 21, 2023  6:48 AM. If you have any questions, ask your nurse or doctor.          acetaminophen 325 MG tablet Commonly known as: TYLENOL Take 325 mg by mouth daily.   albuterol 108 (90 Base) MCG/ACT inhaler Commonly known as: VENTOLIN HFA Inhale 1-2 puffs into the lungs every 6 (six) hours as needed for wheezing or shortness of breath.   ALPRAZolam 0.5 MG tablet Commonly known as: XANAX Take 0.5 mg by mouth 3 (three) times daily as needed.   amLODipine 5 MG tablet Commonly known as: NORVASC Take 5 mg by mouth daily.   Brovana 15 MCG/2ML Nebu Generic drug: arformoterol Take 15 mcg by nebulization daily.   buPROPion 150  MG 24 hr tablet Commonly known as: WELLBUTRIN XL Take 150 mg by mouth daily.   clarithromycin 500 MG tablet Commonly known as: BIAXIN Take 500 mg by mouth See admin instructions. Take 1 tablet (500 mg) by mouth twice daily on Mondays, Wednesdays, & Fridays.   clonazePAM 0.5 MG tablet Commonly known as: KLONOPIN Take 0.5 mg by mouth 2 (two) times daily as needed.   Dupixent 300 MG/2ML prefilled syringe Generic drug: dupilumab Inject into the skin every 14 (fourteen) days.   escitalopram 20 MG tablet Commonly known as: LEXAPRO Take 20 mg by mouth at bedtime.   finasteride 5 MG tablet Commonly known as: PROSCAR Take 2.5 mg by mouth daily.   fluticasone 50 MCG/ACT nasal spray Commonly known as: FLONASE Place 1 spray into both nostrils daily.   HYDROcodone-acetaminophen 5-325 MG tablet Commonly known as:  NORCO/VICODIN Take by mouth every 6 (six) hours as needed.   hydrOXYzine 25 MG tablet Commonly known as: ATARAX Take 25 mg by mouth 3 (three) times daily as needed for anxiety.   ibuprofen 200 MG tablet Commonly known as: ADVIL Take 200 mg by mouth daily.   loratadine 10 MG tablet Commonly known as: CLARITIN Take 20 mg by mouth at bedtime.   losartan-hydrochlorothiazide 100-25 MG tablet Commonly known as: HYZAAR Take 1 tablet by mouth daily.   methimazole 5 MG tablet Commonly known as: TAPAZOLE TAKE 1/2 TABLET BY MOUTH DAILY   montelukast 10 MG tablet Commonly known as: SINGULAIR Take 10 mg by mouth at bedtime.   predniSONE 10 MG tablet Commonly known as: DELTASONE Take 1 tablet (10 mg total) by mouth 3 (three) times daily.   Vitamin D (Ergocalciferol) 1.25 MG (50000 UNIT) Caps capsule Commonly known as: DRISDOL Take 50,000 Units by mouth every Sunday.   Xarelto 20 MG Tabs tablet Generic drug: rivaroxaban Take 1 tablet by mouth daily at 6 (six) AM.          OBJECTIVE:   PHYSICAL EXAM: VS: There were no vitals taken for this visit.   EXAM: General: Pt appears well and is in NAD  Neck: General: Supple without adenopathy. Thyroid: Thyroid size normal.  No goiter or nodules appreciated.  Lungs: Clear with good BS bilat   Heart: Auscultation: RRR.  Extremities:  Trace  edema   Mental Status:  Orientation: Oriented to time, place, and person Mood and affect: No depression, anxiety, or agitation     DATA REVIEWED:  Latest Reference Range & Units 11/19/22 09:18  TSH 0.35 - 5.50 uIU/mL 0.48  T4,Free(Direct) 0.60 - 1.60 ng/dL 9.56   Results for AMANIE, MCCULLEY (MRN [id number]) as of 05/30/2020 12:56  Ref. Range 04/22/2020 14:33  TRAB Latest Ref Range: <=2.00 IU/L <1.00   Thyroid Ultrasound 12/20/2022 Texture: Echotexture is heterogeneous.  Flow in the thyroid is normal Total number of nodules >1 cm =2   Location: Isthmus , maximum size 2.9 cm x 1.5x3.0  cm. composition: Solid/almost completely solid, isoechoic, not taller than wide, margins smooth, no echogenic foci   Nodule #2 2.1 cm x 1.2 x 2 cm Isoechoic, not taller than wide, smooth   Impression 1.  Stable multinodular goiter     Thyroid uptake and scan 05/15/2020  Foci of increased and decreased uptake in both thyroid lobes consistent with multinodular thyroid gland.   No dominant mass.   4 hour I-131 uptake = 6.6% (normal 5-20%)   24 hour I-131 uptake = 16.9% (normal 10-30%)   IMPRESSION: Multinodular thyroid gland.  Normal 4 hour and 24 hour radio iodine uptakes.   ASSESSMENT / PLAN / RECOMMENDATIONS:   Subclinical Hyperthyroidism    - Pt with clinically euthyroid  - This is caused by autonomous MNG - TFT's today are normal     Medication   Continue Methimazole 5 mg, HALF a tablet daily       2. Multinodular Goiter:   - No local neck symptoms  - She is S/P FNA of the left thyroid nodule with benign cytology 05/2020 -Thyroid ultrasound 05/2021 shows stability of the nodules - Will proceed with thyroid ultrasound, she was given an order to take to [facility] imaging    F/U in 1 yr    Signed electronically by: Lyndle Herrlich, MD  [facility]  [facility] [address] [place] [place], [place] [address] Phone: [phone] FAX: [phone]      CC: Rema Jasmine, NP [address] [place] [address] Phone: [phone]  Fax: [phone]   Return to Endocrinology clinic as below: Future Appointments  Date Time Provider Department Center  11/21/2023  7:30 AM Amillion Macchia, Konrad Dolores, MD [id number] None

## 2023-11-25 ENCOUNTER — Ambulatory Visit: Payer: Medicare Other | Admitting: Internal Medicine

## 2023-11-25 ENCOUNTER — Encounter: Payer: Self-pay | Admitting: Internal Medicine

## 2023-11-25 VITALS — BP 104/70 | HR 70 | Ht 61.0 in | Wt 205.0 lb

## 2023-11-25 DIAGNOSIS — E042 Nontoxic multinodular goiter: Secondary | ICD-10-CM | POA: Diagnosis not present

## 2023-11-25 DIAGNOSIS — E0590 Thyrotoxicosis, unspecified without thyrotoxic crisis or storm: Secondary | ICD-10-CM

## 2023-11-25 LAB — T4, FREE: Free T4: 1.1 ng/dL (ref 0.8–1.8)

## 2023-11-25 LAB — T3, FREE: T3, Free: 3.7 pg/mL (ref 2.3–4.2)

## 2023-11-25 LAB — TSH: TSH: 0.31 m[IU]/L — ABNORMAL LOW (ref 0.40–4.50)

## 2023-11-25 NOTE — Progress Notes (Unsigned)
Name: Sabrina Mcdonald  MRN/ DOB: [id number], 1956-01-18    Age/ Sex: 68 y.o., female     PCP: Rema Jasmine, NP   Reason for Endocrinology Evaluation: MNG     Initial Endocrinology Clinic Visit: 04/22/2020    PATIENT IDENTIFIER: Sabrina Mcdonald is a 68 y.o., female with a past medical history of bronchiectasis, Asthma , A. Fib   and depression . She has followed with [facility] since 04/22/2020 for consultative assistance with management of her MNG.   HISTORICAL SUMMARY: Pt presented to her PCP with c/o fatigue, brain fogginess, and  feeling achy , was found to have a low TSH at 0.259 uIU/Ml in 03/2020 . This prompted an ultrasound showing MNG with the largest being a left mid central 1.1x1x1.1 cm nodule.   Uptake and scan showed MNG with a 24-hr uptake of I-131 uptake = 16.9% (normal 10-30%)  Since the uptake was not elevated we opted to treat with methimazole due to multiple nonspecific symptoms .Which was started in 04/2020   S/P benign FNA of the left thyroid nodule 05/2020 She is on prednisone for lung disease since 06/2018   Maternal family history with goiter  SUBJECTIVE:    Today (11/25/2023):  Sabrina Mcdonald is here for a follow up on MNG and hyperthyroidism.  The patient is here for a follow up on MNG and hyperthyroidism.   The patient follows with GI for hepatic granuloma Weight is trending down  Has occasional palpitations  Has chronic hand tremors - essential tremors  Denies constipation nor diarrhea  Continues on chronic prednisone due to chronic lung issues  She has noted with decrease appetite , has no energy , denies depression    Methimazole 5 mg half a tablet daily      HISTORY:  Past Medical History:  Past Medical History:  Diagnosis Date   Abnormal EKG    Anxiety    Fatigue    Heat intolerance    Jaw pain    Migraine    Renal disorder    Past Surgical History:  Past Surgical History:  Procedure Laterality Date   APPENDECTOMY     BRAIN SURGERY     CESAREAN SECTION     COLONOSCOPY WITH  PROPOFOL N/A 04/14/2020   Procedure: COLONOSCOPY WITH PROPOFOL;  Surgeon: Corbin Ade, MD;  [facility]: [facility];  Service: Endoscopy;  Laterality: N/A;  7:30am   kidnsey stones      laproscopy     Social History:  reports that she has never smoked. She has never used smokeless tobacco. She reports that she does not drink alcohol and does not use drugs. Family History:  Family History  Problem Relation Age of Onset   Colon cancer Neg Hx      HOME MEDICATIONS: Allergies as of 11/25/2023       Reactions   Phenobarbital Itching, Rash        Medication List        Accurate as of November 25, 2023  8:32 AM. If you have any questions, ask your nurse or doctor.          acetaminophen 325 MG tablet Commonly known as: TYLENOL Take 325 mg by mouth daily.   albuterol 108 (90 Base) MCG/ACT inhaler Commonly known as: VENTOLIN HFA Inhale 1-2 puffs into the lungs every 6 (six) hours as needed for wheezing or shortness of breath.   ALPRAZolam 0.5 MG tablet Commonly known as: XANAX Take 0.5 mg by mouth 3 (three) times daily as needed.   amLODipine 5 MG  tablet Commonly known as: NORVASC Take 5 mg by mouth daily.   amLODipine 10 MG tablet Commonly known as: NORVASC Take 10 mg by mouth daily.   Brovana 15 MCG/2ML Nebu Generic drug: arformoterol Take 15 mcg by nebulization daily.   buPROPion 150 MG 24 hr tablet Commonly known as: WELLBUTRIN XL Take 150 mg by mouth daily.   clarithromycin 500 MG tablet Commonly known as: BIAXIN Take 500 mg by mouth See admin instructions. Take 1 tablet (500 mg) by mouth twice daily on Mondays, Wednesdays, & Fridays.   clonazePAM 0.5 MG tablet Commonly known as: KLONOPIN Take 0.5 mg by mouth 2 (two) times daily as needed.   Dupixent 300 MG/2ML prefilled syringe Generic drug: dupilumab Inject into the skin every 14 (fourteen) days.   escitalopram 20 MG tablet Commonly known as: LEXAPRO Take 20 mg by mouth at bedtime.    finasteride 5 MG tablet Commonly known as: PROSCAR Take 2.5 mg by mouth daily.   fluticasone 50 MCG/ACT nasal spray Commonly known as: FLONASE Place 1 spray into both nostrils daily.   HYDROcodone-acetaminophen 5-325 MG tablet Commonly known as: NORCO/VICODIN Take by mouth every 6 (six) hours as needed.   hydrOXYzine 25 MG tablet Commonly known as: ATARAX Take 25 mg by mouth 3 (three) times daily as needed for anxiety.   ibuprofen 200 MG tablet Commonly known as: ADVIL Take 200 mg by mouth daily.   loratadine 10 MG tablet Commonly known as: CLARITIN Take 20 mg by mouth at bedtime.   losartan-hydrochlorothiazide 100-25 MG tablet Commonly known as: HYZAAR Take 1 tablet by mouth daily.   lurasidone 20 MG Tabs tablet Commonly known as: LATUDA Take 20 mg by mouth daily.   metFORMIN 500 MG 24 hr tablet Commonly known as: GLUCOPHAGE-XR Take 500 mg by mouth daily.   methimazole 5 MG tablet Commonly known as: TAPAZOLE TAKE 1/2 TABLET BY MOUTH DAILY   metoprolol succinate 25 MG 24 hr tablet Commonly known as: TOPROL-XL Take 25 mg by mouth daily.   montelukast 10 MG tablet Commonly known as: SINGULAIR Take 10 mg by mouth at bedtime.   predniSONE 5 MG tablet Commonly known as: DELTASONE Take 5 mg by mouth daily.   predniSONE 10 MG tablet Commonly known as: DELTASONE Take 1 tablet (10 mg total) by mouth 3 (three) times daily.   rosuvastatin 20 MG tablet Commonly known as: CRESTOR Take 20 mg by mouth daily.   Vitamin D (Ergocalciferol) 1.25 MG (50000 UNIT) Caps capsule Commonly known as: DRISDOL Take 50,000 Units by mouth every Sunday.   Xarelto 20 MG Tabs tablet Generic drug: rivaroxaban Take 1 tablet by mouth daily at 6 (six) AM.          OBJECTIVE:   PHYSICAL EXAM: VS: BP 104/70 (BP Location: Left Arm, Patient Position: Sitting, Cuff Size: Normal)   Pulse 70   Ht 5\' 1"  (1.549 m)   Wt 205 lb (93 kg)   SpO2 94%   BMI 38.73 kg/m     EXAM: General: Pt appears well and is in NAD  Neck: General: Supple without adenopathy. Thyroid: Thyroid size normal.  No goiter or nodules appreciated.  Lungs: Clear with good BS bilat   Heart: Auscultation: RRR.  Extremities:  Trace  edema   Mental Status:  Orientation: Oriented to time, place, and person Mood and affect: No depression, anxiety, or agitation     DATA REVIEWED:  Latest Reference Range & Units 11/25/23 08:56  TSH 0.40 - 4.50 mIU/L 0.31 (  L)  Triiodothyronine,Free,Serum 2.3 - 4.2 pg/mL 3.7  T4,Free(Direct) 0.8 - 1.8 ng/dL 1.1  (L): Data is abnormally low   Results for Sabrina Mcdonald, Sabrina Mcdonald (MRN [id number]) as of 05/30/2020 12:56  Ref. Range 04/22/2020 14:33  TRAB Latest Ref Range: <=2.00 IU/L <1.00   Thyroid Ultrasound 12/20/2022 Texture: Echotexture is heterogeneous.  Flow in the thyroid is normal Total number of nodules >1 cm =2   Location: Isthmus , maximum size 2.9 cm x 1.5x3.0 cm. composition: Solid/almost completely solid, isoechoic, not taller than wide, margins smooth, no echogenic foci   Nodule #2 2.1 cm x 1.2 x 2 cm Isoechoic, not taller than wide, smooth   Impression 1.  Stable multinodular goiter     Thyroid uptake and scan 05/15/2020  Foci of increased and decreased uptake in both thyroid lobes consistent with multinodular thyroid gland.   No dominant mass.   4 hour I-131 uptake = 6.6% (normal 5-20%)   24 hour I-131 uptake = 16.9% (normal 10-30%)   IMPRESSION: Multinodular thyroid gland.   Normal 4 hour and 24 hour radio iodine uptakes.   ASSESSMENT / PLAN / RECOMMENDATIONS:   Subclinical Hyperthyroidism    - Pt with clinically euthyroid  - This is caused by autonomous MNG - TFT's today show low TSH, will increase methimazole as below    Medication   Change Methimazole 5 mg, 1 tablet on Sundays, half a tablet rest of the week      2. Multinodular Goiter:   - No local neck symptoms  - She is S/P FNA of the left  thyroid nodule with benign cytology 05/2020 -Thyroid ultrasound 05/2021 shows stability of the nodules - Thyroid ultrasound  11/2022 showed stability  -We opted to repeat thyroid ultrasound next year   F/U in 6 months    Signed electronically by: Lyndle Herrlich, MD  [facility] End[facility]  [facility] [address] [place], [place] [address] Phone: [phone] FAX: [phone]      CC: Rema Jasmine, NP [address] [place] [address] Phone: [phone]  Fax: [phone]   Return to Endocrinology clinic as below: No future appointments.

## 2023-11-28 ENCOUNTER — Telehealth: Payer: Self-pay | Admitting: Internal Medicine

## 2023-11-28 MED ORDER — METHIMAZOLE 5 MG PO TABS: 5.0000 mg | ORAL_TABLET | ORAL | 3 refills | Status: DC

## 2023-11-28 NOTE — Telephone Encounter (Addendum)
Please let the patient know that her thyroid levels are slightly overactive.  I will suggest that once a week she needs to take 1 tablet of methimazole, for example every Sunday from now ontake 1 tablet .  But continue to take half  tablet Monday through Saturday   Please schedule her for follow-up with me in 6 months rather than a year .   Thanks

## 2023-11-28 NOTE — Telephone Encounter (Signed)
Patient notified and appointment has been schduled

## 2023-12-15 ENCOUNTER — Ambulatory Visit: Payer: Medicare Other | Admitting: Orthopedic Surgery

## 2023-12-15 ENCOUNTER — Encounter: Payer: Self-pay | Admitting: Orthopedic Surgery

## 2023-12-15 DIAGNOSIS — M7052 Other bursitis of knee, left knee: Secondary | ICD-10-CM

## 2023-12-15 MED ORDER — METHYLPREDNISOLONE ACETATE 40 MG/ML IJ SUSP
40.0000 mg | Freq: Once | INTRAMUSCULAR | Status: AC
  Administered 2023-12-15: 40 mg via INTRA_ARTICULAR

## 2023-12-15 NOTE — Progress Notes (Signed)
   There were no vitals taken for this visit.  There is no height or weight on file to calculate BMI.  Chief Complaint  Patient presents with   Knee Pain    Left knee pain  she is in a lot of pain the last month it has been bad   moving at [place] in bed is painful in the knee     Encounter Diagnosis  Name Primary?   Pes anserinus bursitis of left knee Yes    DOI/DOS/ Date: 11/04/2023  Worse

## 2023-12-15 NOTE — Progress Notes (Signed)
        Chief Complaint  Patient presents with   Knee Pain    Left knee pain  she is in a lot of pain the last month it has been bad   moving at [employer] in bed is painful in the knee     Encounter Diagnosis  Name Primary?   Pes anserinus bursitis of left knee Yes    DOI/DOS/ Date: 11/04/2023  Worse  68 year old female presents back with pain over the medial side of her left knee.  Initial injection in October helped significantly but the symptoms seem to have returned over the last 4 weeks  Exam shows tenderness over the pes bursa nontender patella lateral compartment with no signs of instability  Repeat injection  Procedure note Left knee injection for bursitis   verbal consent was obtained to inject Left knee PES BURSA  Timeout was completed to confirm the site of injection  The medications used were 40 mg of Depo-Medrol and 1% lidocaine 3 cc  Anesthesia was provided by ethyl chloride and the skin was prepped with alcohol.  After cleaning the skin with alcohol a 25-gauge needle was used to inject the left knee bursa   There were no complications and a sterile bandage was applied

## 2024-07-10 ENCOUNTER — Encounter: Payer: Self-pay | Admitting: Gastroenterology

## 2024-08-08 ENCOUNTER — Telehealth: Payer: Self-pay

## 2024-08-08 NOTE — Telephone Encounter (Signed)
 I will be awaiting on her lab results

## 2024-08-08 NOTE — Telephone Encounter (Signed)
 Patient states that she has been experiencing a bad smell when she tries to have coffee or certain foods.The smell is so bad to the point where she has to vomit. This started about 4 weeks ago. Patient feels very fatigue and has no energy to do anything. Patient had lab work on Monday and will call over to have them fax labs to our office.

## 2024-08-24 ENCOUNTER — Other Ambulatory Visit: Payer: Self-pay | Admitting: Internal Medicine

## 2024-09-02 NOTE — Progress Notes (Unsigned)
 GI Office Note    Referring Provider: Fredericka Duncan, NP Primary Care Physician:  Fredericka Duncan, NP Primary Gastroenterologist: Lamar HERO.Rourk, MD  Date:  09/02/2024  ID:  Sabrina Mcdonald, DOB 23-May-1956, MRN [id number]   Chief Complaint   No chief complaint on file.  History of Present Illness  Sabrina Mcdonald is a 68 y.o. female with a history of *** presenting today with complaint of   Colonoscopy June 2021: -sigmoid and descending diverticulosis -non bleeding internal hemorrhoids -Repeat colonoscopy in 5 years.    Office visit 02/10/23.  Improvement of rectal bleeding since prior visit.  Still has pain but does not occur very often. (Only complaint of 2 instances lasting 5-10 minutes at a time) pain usually happens when she sleeps and wakes her up.  Denies constipation, diarrhea.  Advised to continue compounded hemorrhoid cream 1-2 times daily for 1-2 weeks.  Will consider anoscopy at next visit if still ongoing consider biofeedback versus colonoscopy.   CT A/P 02/02/2023: -Low-attenuation liver reflective of steatosis -They are too small to characterize but likely a cyst -Hepatic granuloma -Gallbladder unremarkable -Normal pancreas and spleen -Subcentimeter nonobstructive left renal stone   OV 03/14/23.  Following up on some abnormal liver findings from recent CT scan of her kidney.  No alarm symptoms present in regards to granuloma.  Continued on low-dose prednisone  and clarithromycin for bronchiectasis and still with some intermittent dyspnea on exertion.  Denied any rectal bleeding and rectal pain is improved.  Had about 4 instances since her last visit. RUQ US , labs. Monitor for worsening rectal pain. F/u 4 months.   Labs 03/14/23: Negative TB and AMA. Normal CBC, CMP, INR.    RUQ US  03/31/23: -CBD 5 mm -No focal liver lesion -Hepatic steatosis -Small calcified granuloma measuring 5 mm in her left hepatic lobe -Patent portal vein -5 mm stone within the right kidney  Last  office visit 07/28/2023. Tea causes bloating, water is the only thing. Has BM first thing in the mornings. Rectal spasms have resolved. No alarm symptoms of hepatic granuloma.  Labs 08/06/24: H pylori negative. CBC normal. Anemia panel normal. CMP with mildly elevated ALT 33.   Today:     Wt Readings from Last 6 Encounters:  11/25/23 205 lb (93 kg)  08/22/23 216 lb (98 kg)  07/28/23 213 lb 12.8 oz (97 kg)  03/14/23 215 lb 9.6 oz (97.8 kg)  02/10/23 211 lb 6.4 oz (95.9 kg)  12/27/22 216 lb 12.8 oz (98.3 kg)    There is no height or weight on file to calculate BMI.   Current Outpatient Medications  Medication Sig Dispense Refill   acetaminophen  (TYLENOL ) 325 MG tablet Take 325 mg by mouth daily.     albuterol (VENTOLIN HFA) 108 (90 Base) MCG/ACT inhaler Inhale 1-2 puffs into the lungs every 6 (six) hours as needed for wheezing or shortness of breath.     ALPRAZolam  (XANAX ) 0.5 MG tablet Take 0.5 mg by mouth 3 (three) times daily as needed.     amLODipine (NORVASC) 10 MG tablet Take 10 mg by mouth daily.     amLODipine (NORVASC) 5 MG tablet Take 5 mg by mouth daily.     BROVANA 15 MCG/2ML NEBU Take 15 mcg by nebulization daily.     buPROPion (WELLBUTRIN XL) 150 MG 24 hr tablet Take 150 mg by mouth daily.     clarithromycin (BIAXIN) 500 MG tablet Take 500 mg by mouth See admin instructions. Take 1 tablet (500 mg) by  mouth twice daily on Mondays, Wednesdays, & Fridays.     clonazePAM (KLONOPIN) 0.5 MG tablet Take 0.5 mg by mouth 2 (two) times daily as needed.     DUPIXENT 300 MG/2ML prefilled syringe Inject into the skin every 14 (fourteen) days.     escitalopram (LEXAPRO) 20 MG tablet Take 20 mg by mouth at bedtime.     finasteride (PROSCAR) 5 MG tablet Take 2.5 mg by mouth daily.     fluticasone (FLONASE) 50 MCG/ACT nasal spray Place 1 spray into both nostrils daily.      HYDROcodone-acetaminophen  (NORCO/VICODIN) 5-325 MG tablet Take by mouth every 6 (six) hours as needed.      hydrOXYzine (ATARAX/VISTARIL) 25 MG tablet Take 25 mg by mouth 3 (three) times daily as needed for anxiety.     ibuprofen (ADVIL) 200 MG tablet Take 200 mg by mouth daily.     loratadine (CLARITIN) 10 MG tablet Take 20 mg by mouth at bedtime.     losartan -hydrochlorothiazide (HYZAAR) 100-25 MG per tablet Take 1 tablet by mouth daily. 90 tablet 0   lurasidone (LATUDA) 20 MG TABS tablet Take 20 mg by mouth daily.     metFORMIN (GLUCOPHAGE-XR) 500 MG 24 hr tablet Take 500 mg by mouth daily.     methimazole  (TAPAZOLE ) 5 MG tablet TAKE 1/2 TABLET BY MOUTH DAILY 45 tablet 1   metoprolol succinate (TOPROL-XL) 25 MG 24 hr tablet Take 25 mg by mouth daily.     montelukast (SINGULAIR) 10 MG tablet Take 10 mg by mouth at bedtime.     predniSONE  (DELTASONE ) 5 MG tablet Take 5 mg by mouth daily.     rosuvastatin (CRESTOR) 20 MG tablet Take 20 mg by mouth daily.     Vitamin D, Ergocalciferol, (DRISDOL) 1.25 MG (50000 UNIT) CAPS capsule Take 50,000 Units by mouth every Sunday.     XARELTO 20 MG TABS tablet Take 1 tablet by mouth daily at 6 (six) AM. (Patient not taking: Reported on 11/25/2023)     No current facility-administered medications for this visit.    Past Medical History:  Diagnosis Date   Abnormal EKG    Anxiety    Fatigue    Heat intolerance    Jaw pain    Migraine    Renal disorder     Past Surgical History:  Procedure Laterality Date   APPENDECTOMY     BRAIN SURGERY     CESAREAN SECTION     COLONOSCOPY WITH PROPOFOL  N/A 04/14/2020   Procedure: COLONOSCOPY WITH PROPOFOL ;  Surgeon: Shaaron Lamar HERO, MD;  [facility]: [facility];  Service: Endoscopy;  Laterality: N/A;  7:30am   kidnsey stones      laproscopy      Family History  Problem Relation Age of Onset   Colon cancer Neg Hx     Allergies as of 09/03/2024 - Review Complete 12/15/2023  Allergen Reaction Noted   Phenobarbital Itching and Rash 04/14/2011    Social History   Socioeconomic History   Marital status:  Married    Spouse name: Not on file   Number of children: Not on file   Years of education: Not on file   Highest education level: Not on file  Occupational History   Not on file  Tobacco Use   Smoking status: Never   Smokeless tobacco: Never  Vaping Use   Vaping status: Never Used  Substance and Sexual Activity   Alcohol use: No   Drug use: No   Sexual  activity: Yes  Other Topics Concern   Not on file  Social History Narrative   Right handed    Lies with husband    Social Drivers of Corporate Investment Banker Strain: Not on file  Food Insecurity: Not on file  Transportation Needs: Not on file  Physical Activity: Not on file  Stress: Not on file  Social Connections: Not on file    Review of Systems   Gen: Denies fever, chills, anorexia. Denies fatigue, weakness, weight loss.  CV: Denies chest pain, palpitations, syncope, peripheral edema, and claudication. Resp: Denies dyspnea at rest, cough, wheezing, coughing up blood, and pleurisy. GI: See HPI Derm: Denies rash, itching, dry skin Psych: Denies depression, anxiety, memory loss, confusion. No homicidal or suicidal ideation.  Heme: Denies bruising, bleeding, and enlarged lymph nodes.  Physical Exam   There were no vitals taken for this visit.  General:   Alert and oriented. No distress noted. Pleasant and cooperative.  Head:  Normocephalic and atraumatic. Eyes:  Conjuctiva clear without scleral icterus. Mouth:  Oral mucosa pink and moist. Good dentition. No lesions. Lungs:  Clear to auscultation bilaterally. No wheezes, rales, or rhonchi. No distress.  Heart:  S1, S2 present without murmurs appreciated.  Abdomen:  +BS, soft, non-tender and non-distended. No rebound or guarding. No HSM or masses noted. Rectal: *** Msk:  Symmetrical without gross deformities. Normal posture. Extremities:  Without edema. Neurologic:  Alert and  oriented x4 Psych:  Alert and cooperative. Normal mood and affect.  Assessment &  Plan  Sabrina Mcdonald is a 68 y.o. female presenting today with ***  Hepatic granuloma Elevated ALT  Follow up   Follow up ***  Recall triage for colonoscopy June 2026.   Charmaine Melia, MSN, FNP-BC, AGACNP-BC [facility]

## 2024-09-03 ENCOUNTER — Encounter: Payer: Self-pay | Admitting: Gastroenterology

## 2024-09-03 ENCOUNTER — Ambulatory Visit: Admitting: Gastroenterology

## 2024-09-03 ENCOUNTER — Telehealth: Payer: Self-pay | Admitting: *Deleted

## 2024-09-03 VITALS — BP 115/71 | HR 71 | Temp 98.0°F | Ht 61.0 in | Wt 190.6 lb

## 2024-09-03 DIAGNOSIS — R431 Parosmia: Secondary | ICD-10-CM

## 2024-09-03 DIAGNOSIS — K753 Granulomatous hepatitis, not elsewhere classified: Secondary | ICD-10-CM | POA: Diagnosis not present

## 2024-09-03 DIAGNOSIS — Z860100 Personal history of colon polyps, unspecified: Secondary | ICD-10-CM

## 2024-09-03 NOTE — Patient Instructions (Addendum)
 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  It is also something that can occur with chronic rhinosinusitis.  I printed out some basic information about parosmia for you.  I agree with you seeing ENT for this to see if they have more insight.  As we discussed just in case any of the nausea is secondary to some underlying mild reflux/atypical GERD you could try over-the-counter Pepcid/famotidine to help with the nausea as well as consider trying any over-the-counter zinc supplementation to help with taste issues.  Given your chronic sinus congestion as well and asthma you could consider starting Flonase/Nasacort regularly but you can also wait to discuss this with ENT.  We will get you scheduled for a right upper quadrant ultrasound for surveillance of your hepatic granuloma.  If there is been any increase in size then I would recommend some additional lab work to assess for cause however for now given your mildly elevated ALT I recommend us  monitoring this and rechecking labs again in 6 months.   It was a pleasure to see you today. I want to create trusting relationships with patients. If you receive a survey regarding your visit,  I greatly appreciate you taking time to fill this out on paper or through your MyChart. I value your feedback.  Charmaine Melia, MSN, FNP-BC, AGACNP-BC [facility]

## 2024-09-03 NOTE — Telephone Encounter (Signed)
 Called pt, LMOVM with ultrasound appointment details (ok per dpr)

## 2024-09-11 ENCOUNTER — Ambulatory Visit: Payer: Self-pay | Admitting: Gastroenterology

## 2024-09-11 ENCOUNTER — Ambulatory Visit (HOSPITAL_COMMUNITY)
Admission: RE | Admit: 2024-09-11 | Discharge: 2024-09-11 | Disposition: A | Discharge status: Home / Self Care | Source: Ambulatory Visit | Attending: Gastroenterology | Admitting: Gastroenterology

## 2024-09-11 DIAGNOSIS — K753 Granulomatous hepatitis, not elsewhere classified: Secondary | ICD-10-CM | POA: Insufficient documentation

## 2024-09-27 ENCOUNTER — Encounter: Payer: Self-pay | Admitting: Neurology

## 2024-10-28 NOTE — Progress Notes (Unsigned)
 "  NEUROLOGY CONSULTATION NOTE  Sabrina Mcdonald MRN: [id number] DOB: 1956-05-04  Referring provider: Neville Friedlander, NP Primary care provider: Alan Eck, FNP  Reason for consult:  phantosmia  Assessment/Plan:   Phantosmia - unclear etiology.  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  As symptoms started 30 years after the surgery, query possibility of olfactory seizures.    Check routine EEG.  Will have her trigger an event by having her smell or drink coffee.  If event not captured and EEG unremarkable, would order 24 hour ambulatory EEG. Further recommendations pending results.   Subjective:  Sabrina Mcdonald is a 69 year old female with bronchiectasis, asthma, nasal turbinate hypertrophy, chronic rhinitis, PTSD, depression, hyperthyroidism, multinodular goiter, essential tremor and history of colloid cyst resection in 1996 who presents for phantosmia.  Discussed the use of AI scribe software for clinical note transcription with the patient, who gave verbal consent to proceed.  History of Present Illness  Since September 10th, she has experienced a persistent and episodic foul smell described as 'a smell from hell,' which is nauseating and has occasionally led to vomiting. The smell is likened to something rotten and is particularly triggered by coffee, which she can no longer drink as it tastes like the smell. This has affected her ability to eat certain foods as the taste is altered by the smell. The smell is not constant but comes and goes, sometimes waking her from sleep. It can be triggered by various stimuli, including the smell of her hands after washing them.  She initially consulted her primary care doctor, who conducted a stomach test for bacteria, which returned negative. She was then referred to an ENT specialist, who ordered an MRI of her brain. She had an MRI of the brain with and without contrast on 09/20/2024 which demonstrated  encephalomalacic changes of the right frontal lobe, likely sequelae of remote colloid cyst resection, and nonspecific mild to moderate scattered white matter T2/FLAIR hyperintensity(s) but no acute intracranial findings.  She has a history of brain surgery in 1996 for a colloid cyst, during which she was placed on phenobarbital prophylactically but had allergic reactions to them. She has not been on anti-seizure medications since then and has not experienced seizures.      Past antihypertensive medications:  amlodipine Past antidepressant medications:  Lexapro, Prozac  Past anticonvulsant medications:  phenobarbital (post brain surgery, side effect)  Current Antihypertensive medications:  metoprolol succinate ER 25mg , losartan -hydrochlorothiazide Current Antihistamines/Decongestants:  Claritin, Flonase Other medications:  Xarelto, alpraxolam, Dupixent, methimazole , metformin, lurasidone, prednisone  5mg  daily, tapazole    PAST MEDICAL HISTORY: Past Medical History:  Diagnosis Date   Abnormal EKG    Anxiety    Fatigue    Heat intolerance    Jaw pain    Migraine    Renal disorder     PAST SURGICAL HISTORY: Past Surgical History:  Procedure Laterality Date   APPENDECTOMY     BRAIN SURGERY     CESAREAN SECTION     COLONOSCOPY WITH PROPOFOL  N/A 04/14/2020   Procedure: COLONOSCOPY WITH PROPOFOL ;  Surgeon: Shaaron Lamar HERO, MD;  Location: [facility];  Service: Endoscopy;  Laterality: N/A;  7:30am   kidnsey stones      laproscopy      MEDICATIONS: Medications Ordered Prior to Encounter[1]  ALLERGIES: Allergies[2]  FAMILY HISTORY: Family History  Problem Relation Age of Onset   Colon cancer Neg Hx     Objective:  Blood pressure 105/68, pulse 89, height 5'  1 (1.549 m), weight 191 lb 3.2 oz (86.7 kg), SpO2 96%. General: No acute distress.  Patient appears well-groomed.   Head:  Normocephalic/atraumatic Eyes:  fundi examined but not visualized Neck: supple, no  paraspinal tenderness, full range of motion Heart: regular rate and rhythm Neurological Exam: Mental status: alert and oriented to person, place, and time, speech fluent and not dysarthric, language intact. Cranial nerves: CN I: not tested CN II: pupils equal, round and reactive to light, visual fields intact CN III, IV, VI:  full range of motion, no nystagmus, no ptosis CN V: facial sensation intact. CN VII: upper and lower face symmetric CN VIII: hearing intact CN IX, X: gag intact, uvula midline CN XI: sternocleidomastoid and trapezius muscles intact CN XII: tongue midline Bulk & Tone: normal, no fasciculations. Motor:  muscle strength 5/5 throughout.  Postural and kinetic tremor in hands. Sensation:  Pinprick and vibratory sensation intact. Deep Tendon Reflexes:  2+ throughout,  toes downgoing.   Finger to nose testing:  Without dysmetria.   Gait:  Normal station and stride.  Romberg negative.    Juliene Dunnings, DO  CC:  Alan Eck, [facility]  Neville Friedlander, NP        [1]  Current Outpatient Medications on File Prior to Visit  Medication Sig Dispense Refill   acetaminophen  (TYLENOL ) 325 MG tablet Take 325 mg by mouth daily.     albuterol (VENTOLIN HFA) 108 (90 Base) MCG/ACT inhaler Inhale 1-2 puffs into the lungs every 6 (six) hours as needed for wheezing or shortness of breath.     ALPRAZolam  (XANAX ) 0.5 MG tablet Take 0.5 mg by mouth 3 (three) times daily as needed.     BROVANA 15 MCG/2ML NEBU Take 15 mcg by nebulization daily.     clarithromycin (BIAXIN) 500 MG tablet Take 500 mg by mouth See admin instructions. Take 1 tablet (500 mg) by mouth twice daily on Mondays, Wednesdays, & Fridays.     clonazePAM (KLONOPIN) 0.5 MG tablet Take 0.5 mg by mouth 2 (two) times daily as needed.     DUPIXENT 300 MG/2ML prefilled syringe Inject into the skin every 14 (fourteen) days.     escitalopram (LEXAPRO) 20 MG tablet Take 20 mg by mouth at bedtime. (Patient not taking: Reported on  09/03/2024)     finasteride (PROSCAR) 5 MG tablet Take 2.5 mg by mouth daily.     fluticasone (FLONASE) 50 MCG/ACT nasal spray Place 1 spray into both nostrils daily.      hydrOXYzine (ATARAX/VISTARIL) 25 MG tablet Take 25 mg by mouth 3 (three) times daily as needed for anxiety.     ibuprofen (ADVIL) 200 MG tablet Take 200 mg by mouth daily.     loratadine (CLARITIN) 10 MG tablet Take 20 mg by mouth at bedtime.     losartan -hydrochlorothiazide (HYZAAR) 100-25 MG per tablet Take 1 tablet by mouth daily. 90 tablet 0   lurasidone (LATUDA) 20 MG TABS tablet Take 20 mg by mouth daily.     metFORMIN (GLUCOPHAGE-XR) 500 MG 24 hr tablet Take 500 mg by mouth daily.     methimazole  (TAPAZOLE ) 5 MG tablet TAKE 1/2 TABLET BY MOUTH DAILY 45 tablet 1   metoprolol succinate (TOPROL-XL) 25 MG 24 hr tablet Take 25 mg by mouth daily.     montelukast (SINGULAIR) 10 MG tablet Take 10 mg by mouth at bedtime.     predniSONE  (DELTASONE ) 5 MG tablet Take 5 mg by mouth daily.     rosuvastatin (CRESTOR) 20  MG tablet Take 20 mg by mouth daily.     Vitamin D, Ergocalciferol, (DRISDOL) 1.25 MG (50000 UNIT) CAPS capsule Take 50,000 Units by mouth every Sunday.     XARELTO 20 MG TABS tablet Take 1 tablet by mouth daily at 6 (six) AM.     No current facility-administered medications on file prior to visit.  [2]  Allergies Allergen Reactions   Phenobarbital Itching and Rash   "

## 2024-10-29 ENCOUNTER — Ambulatory Visit: Admitting: Neurology

## 2024-10-29 ENCOUNTER — Encounter: Payer: Self-pay | Admitting: Neurology

## 2024-10-29 VITALS — BP 105/68 | HR 89 | Ht 61.0 in | Wt 191.2 lb

## 2024-10-29 DIAGNOSIS — R442 Other hallucinations: Secondary | ICD-10-CM | POA: Diagnosis not present

## 2024-10-29 DIAGNOSIS — Z9889 Other specified postprocedural states: Secondary | ICD-10-CM

## 2024-10-29 NOTE — Patient Instructions (Signed)
 Check routine EEG.  If you do not have an event, will order 24 hour ambulatory EEG Further recommendations pending results.

## 2024-10-30 ENCOUNTER — Ambulatory Visit: Admitting: Neurology

## 2024-10-30 DIAGNOSIS — R442 Other hallucinations: Secondary | ICD-10-CM

## 2024-10-30 NOTE — Progress Notes (Unsigned)
 EEG complete and ready for review.

## 2024-10-31 ENCOUNTER — Telehealth: Payer: Self-pay | Admitting: Neurology

## 2024-10-31 NOTE — Telephone Encounter (Signed)
 Reviewed EEG.  It is normal.  I don't have a definite answer to cause of these phantom smells.  In case they are seizures, we can do a trial where she takes an anti-seizure medication for 3 months and see if they improve.  I would recommend lacosamide.

## 2024-10-31 NOTE — Progress Notes (Addendum)
 ELECTROENCEPHALOGRAM REPORT  Date of Study: 10/30/2024  Patient's Name: Sabrina Mcdonald MRN: [id number] Date of Birth: [birth year]  Clinical History: 69 year old female with remote colloid cyst resection presents with phantosmia, often triggered by certain scents such as coffee.  Medications: Medications Ordered Prior to Encounter[1]   Technical Summary: A multichannel digital EEG recording measured by the international 10-20 system with electrodes applied with paste and impedances below 5000 ohms performed in our laboratory with EKG monitoring in an awake patient.  Hyperventilation and photic stimulation were performed.  The digital EEG was referentially recorded, reformatted, and digitally filtered in a variety of bipolar and referential montages for optimal display.    Description: The patient is awake during the recording.  During maximal wakefulness, there is a symmetric, medium voltage 9-10 Hz posterior dominant rhythm that attenuates with eye opening.  The record is symmetric.  Stage 2 sleep was not seen.  Hyperventilation and photic stimulation did not elicit any abnormalities.  At 8 minutes and at 54 minutes into the study, patient smelled coffee eliciting the olfactory hallucination without electrographic correlate.  There were no epileptiform discharges or electrographic seizures seen.    EKG lead was unremarkable.  Impression: This awake EEG is normal.      Juliene Dunnings, DO     [1]  Current Outpatient Medications on File Prior to Visit  Medication Sig Dispense Refill   acetaminophen  (TYLENOL ) 325 MG tablet Take 325 mg by mouth daily.     albuterol (VENTOLIN HFA) 108 (90 Base) MCG/ACT inhaler Inhale 1-2 puffs into the lungs every 6 (six) hours as needed for wheezing or shortness of breath.     ALPRAZolam  (XANAX ) 0.5 MG tablet Take 0.5 mg by mouth 3 (three) times daily as needed.     BROVANA 15 MCG/2ML NEBU Take 15 mcg by nebulization daily.     clarithromycin (BIAXIN) 500 MG  tablet Take 500 mg by mouth See admin instructions. Take 1 tablet (500 mg) by mouth twice daily on Mondays, Wednesdays, & Fridays.     DUPIXENT 300 MG/2ML prefilled syringe Inject into the skin every 14 (fourteen) days.     fluticasone (FLONASE) 50 MCG/ACT nasal spray Place 1 spray into both nostrils daily.      ibuprofen (ADVIL) 200 MG tablet Take 200 mg by mouth daily.     loratadine (CLARITIN) 10 MG tablet Take 20 mg by mouth at bedtime.     losartan -hydrochlorothiazide (HYZAAR) 100-25 MG per tablet Take 1 tablet by mouth daily. 90 tablet 0   lurasidone (LATUDA) 20 MG TABS tablet Take 20 mg by mouth daily.     metFORMIN (GLUCOPHAGE-XR) 500 MG 24 hr tablet Take 500 mg by mouth daily.     methimazole  (TAPAZOLE ) 5 MG tablet TAKE 1/2 TABLET BY MOUTH DAILY 45 tablet 1   metoprolol succinate (TOPROL-XL) 25 MG 24 hr tablet Take 25 mg by mouth daily.     montelukast (SINGULAIR) 10 MG tablet Take 10 mg by mouth at bedtime.     predniSONE  (DELTASONE ) 5 MG tablet Take 5 mg by mouth daily.     rosuvastatin (CRESTOR) 20 MG tablet Take 20 mg by mouth daily.     Vitamin D, Ergocalciferol, (DRISDOL) 1.25 MG (50000 UNIT) CAPS capsule Take 50,000 Units by mouth every Sunday.     XARELTO 20 MG TABS tablet Take 1 tablet by mouth daily at 6 (six) AM.     No current facility-administered medications on file prior to visit.

## 2024-11-01 NOTE — Telephone Encounter (Signed)
 LMOVM to call the office.

## 2024-11-02 ENCOUNTER — Telehealth: Payer: Self-pay | Admitting: Neurology

## 2024-11-02 NOTE — Telephone Encounter (Signed)
 LMOVM for patient.

## 2024-11-02 NOTE — Telephone Encounter (Signed)
 Pt cld bk for results

## 2024-11-02 NOTE — Telephone Encounter (Signed)
 Sabrina Mcdonald(Self) lvm returning from the office. Would like a call back before the office closes.  PH: Didn't leave #

## 2024-11-02 NOTE — Telephone Encounter (Addendum)
 Patient advised of her EEG results, Patient bought MRI CD for Dr.Jaffe to review.  It is on Dr. Skeet desk  Patient states she feels like the test was a waste of her time did he not see anything.  She feel like she not getting any answers.   Advised patient the CD is on his desk when he returns on Monday he will review. If there is more to add we will give her a call.

## 2024-11-05 NOTE — Telephone Encounter (Signed)
 [facility] Call ID: [id number]  [facility]: [phone]  Caller stated she is asking to speak with provider to go over  her recent MRI results.

## 2024-11-06 ENCOUNTER — Telehealth: Payer: Self-pay | Admitting: Neurology

## 2024-11-06 NOTE — Telephone Encounter (Signed)
 Pt. called in this afternoon and she stated that she wants some answers on what is going on with the MRI results . Pt state that she is concern about what is on the MRI. Please call. Thanks

## 2024-11-06 NOTE — Telephone Encounter (Signed)
" °  Patient advised of Dr.jaffe note, Reviewed MRI. The scaring from the brain surgery is located in a region that may contribute to phantom smells. Because symptoms started decades later, I am still concerned about an atypical seizures causing these phantom smells. Would she be willing to try an antiseizure medication to see if symptoms improve?     Patient states she would rather come in to discuss medication with Dr.Jaffe. Since her surgery she hasn't been able to take Anti seizure medication.   She has an allergy to everything and would like to do a face to face to discuss.  "

## 2024-11-06 NOTE — Telephone Encounter (Signed)
 See other encounter MRI results

## 2024-11-07 NOTE — Telephone Encounter (Signed)
 Front desk to call patient to schedule.

## 2024-11-14 ENCOUNTER — Encounter (HOSPITAL_COMMUNITY): Payer: Self-pay | Admitting: *Deleted

## 2024-11-14 ENCOUNTER — Emergency Department (HOSPITAL_COMMUNITY)

## 2024-11-14 ENCOUNTER — Emergency Department (HOSPITAL_COMMUNITY)
Admission: EM | Admit: 2024-11-14 | Discharge: 2024-11-14 | Disposition: A | Discharge status: Home / Self Care | Source: Ambulatory Visit | Attending: Emergency Medicine | Admitting: Emergency Medicine

## 2024-11-14 ENCOUNTER — Other Ambulatory Visit: Payer: Self-pay

## 2024-11-14 DIAGNOSIS — Z7984 Long term (current) use of oral hypoglycemic drugs: Secondary | ICD-10-CM | POA: Insufficient documentation

## 2024-11-14 DIAGNOSIS — Z79899 Other long term (current) drug therapy: Secondary | ICD-10-CM | POA: Diagnosis not present

## 2024-11-14 DIAGNOSIS — Z7901 Long term (current) use of anticoagulants: Secondary | ICD-10-CM | POA: Diagnosis not present

## 2024-11-14 DIAGNOSIS — I4891 Unspecified atrial fibrillation: Secondary | ICD-10-CM | POA: Insufficient documentation

## 2024-11-14 DIAGNOSIS — R109 Unspecified abdominal pain: Secondary | ICD-10-CM | POA: Diagnosis present

## 2024-11-14 DIAGNOSIS — R319 Hematuria, unspecified: Secondary | ICD-10-CM | POA: Insufficient documentation

## 2024-11-14 DIAGNOSIS — N200 Calculus of kidney: Secondary | ICD-10-CM | POA: Diagnosis not present

## 2024-11-14 LAB — CBC WITH DIFFERENTIAL/PLATELET
Abs Immature Granulocytes: 0.01 K/uL (ref 0.00–0.07)
Basophils Absolute: 0 K/uL (ref 0.0–0.1)
Basophils Relative: 1 %
Eosinophils Absolute: 0.2 K/uL (ref 0.0–0.5)
Eosinophils Relative: 3 %
HCT: 40 % (ref 36.0–46.0)
Hemoglobin: 13 g/dL (ref 12.0–15.0)
Immature Granulocytes: 0 %
Lymphocytes Relative: 37 %
Lymphs Abs: 2.8 K/uL (ref 0.7–4.0)
MCH: 27.5 pg (ref 26.0–34.0)
MCHC: 32.5 g/dL (ref 30.0–36.0)
MCV: 84.7 fL (ref 80.0–100.0)
Monocytes Absolute: 0.6 K/uL (ref 0.1–1.0)
Monocytes Relative: 8 %
Neutro Abs: 3.9 K/uL (ref 1.7–7.7)
Neutrophils Relative %: 51 %
Platelets: 244 K/uL (ref 150–400)
RBC: 4.72 MIL/uL (ref 3.87–5.11)
RDW: 14.6 % (ref 11.5–15.5)
WBC: 7.5 K/uL (ref 4.0–10.5)
nRBC: 0 % (ref 0.0–0.2)

## 2024-11-14 LAB — COMPREHENSIVE METABOLIC PANEL WITH GFR
ALT: 21 U/L (ref 0–44)
AST: 22 U/L (ref 15–41)
Albumin: 4.1 g/dL (ref 3.5–5.0)
Alkaline Phosphatase: 80 U/L (ref 38–126)
Anion gap: 15 (ref 5–15)
BUN: 8 mg/dL (ref 8–23)
CO2: 22 mmol/L (ref 22–32)
Calcium: 9.9 mg/dL (ref 8.9–10.3)
Chloride: 102 mmol/L (ref 98–111)
Creatinine, Ser: 0.58 mg/dL (ref 0.44–1.00)
GFR, Estimated: >60 mL/min
Glucose, Bld: 87 mg/dL (ref 70–99)
Potassium: 3.3 mmol/L — ABNORMAL LOW (ref 3.5–5.1)
Sodium: 139 mmol/L (ref 135–145)
Total Bilirubin: 0.8 mg/dL (ref 0.0–1.2)
Total Protein: 6.7 g/dL (ref 6.5–8.1)

## 2024-11-14 LAB — URINALYSIS, ROUTINE W REFLEX MICROSCOPIC
Bilirubin Urine: NEGATIVE
Glucose, UA: NEGATIVE mg/dL
Ketones, ur: NEGATIVE mg/dL
Nitrite: NEGATIVE
Protein, ur: 100 mg/dL — AB
RBC / HPF: >50 RBC/hpf (ref 0–5)
Specific Gravity, Urine: 1.004 — ABNORMAL LOW (ref 1.005–1.030)
pH: 6 (ref 5.0–8.0)

## 2024-11-14 LAB — LIPASE, BLOOD: Lipase: 52 U/L — ABNORMAL HIGH (ref 11–51)

## 2024-11-14 MED ORDER — FENTANYL CITRATE (PF) 100 MCG/2ML IJ SOLN: 50.0000 ug | Freq: Once | INTRAMUSCULAR | Status: DC

## 2024-11-14 MED ORDER — ONDANSETRON HCL 4 MG/2ML IJ SOLN: 4.0000 mg | Freq: Once | INTRAMUSCULAR | Status: DC

## 2024-11-14 NOTE — ED Provider Notes (Signed)
 "  EMERGENCY DEPARTMENT AT [facility] Provider Note   CSN: [id number] Arrival date & time: 11/14/24  1645     Patient presents with: Hematuria   Sabrina Mcdonald is a 69 y.o. female.   HPI Patient presents with hematuria, intermittent abdominal pain.  She has a history of A-fib, is on Xarelto.  She also has a history of kidney stones.  Today, in the morning she had abdominal pain, had bright red blood in her urine.  Over the course the day the urine has become less obviously red, though it remains discolored.  There was some dysuria earlier, this has resolved.  Abdominal pain is resolved after Tylenol .    Prior to Admission medications  Medication Sig Start Date End Date Taking? Authorizing Provider  acetaminophen  (TYLENOL ) 325 MG tablet Take 325 mg by mouth daily.    [provider]  albuterol (VENTOLIN HFA) 108 (90 Base) MCG/ACT inhaler Inhale 1-2 puffs into the lungs every 6 (six) hours as needed for wheezing or shortness of breath. 04/04/20   [provider]  ALPRAZolam  (XANAX ) 0.5 MG tablet Take 0.5 mg by mouth 3 (three) times daily as needed. 08/16/23   [provider]  BROVANA 15 MCG/2ML NEBU Take 15 mcg by nebulization daily. 03/04/20   [provider]  clarithromycin (BIAXIN) 500 MG tablet Take 500 mg by mouth See admin instructions. Take 1 tablet (500 mg) by mouth twice daily on Mondays, Wednesdays, & Fridays.    [provider]  DUPIXENT 300 MG/2ML prefilled syringe Inject into the skin every 14 (fourteen) days. 04/14/23   [provider]  fluticasone (FLONASE) 50 MCG/ACT nasal spray Place 1 spray into both nostrils daily.  04/08/13   [provider]  ibuprofen (ADVIL) 200 MG tablet Take 200 mg by mouth daily.    [provider]  loratadine (CLARITIN) 10 MG tablet Take 20 mg by mouth at bedtime.    [provider]  losartan -hydrochlorothiazide (HYZAAR) 100-25 MG per tablet Take 1  tablet by mouth daily. 07/11/13 10/29/24  Debarah Isaiah CROME, MD  lurasidone (LATUDA) 20 MG TABS tablet Take 20 mg by mouth daily. 09/10/23   [provider]  metFORMIN (GLUCOPHAGE-XR) 500 MG 24 hr tablet Take 500 mg by mouth daily. 09/14/23   [provider]  methimazole  (TAPAZOLE ) 5 MG tablet TAKE 1/2 TABLET BY MOUTH DAILY 08/28/24   Shamleffer, Ibtehal Jaralla, MD  metoprolol succinate (TOPROL-XL) 25 MG 24 hr tablet Take 25 mg by mouth daily. 10/07/23   [provider]  montelukast (SINGULAIR) 10 MG tablet Take 10 mg by mouth at bedtime. 03/05/20   [provider]  predniSONE  (DELTASONE ) 5 MG tablet Take 5 mg by mouth daily.    [provider]  rosuvastatin (CRESTOR) 20 MG tablet Take 20 mg by mouth daily. 09/14/23   [provider]  Vitamin D, Ergocalciferol, (DRISDOL) 1.25 MG (50000 UNIT) CAPS capsule Take 50,000 Units by mouth every Sunday. 03/27/20   [provider]  XARELTO 20 MG TABS tablet Take 1 tablet by mouth daily at 6 (six) AM. 09/02/21   [provider]    Allergies: Phenobarbital    Review of Systems  Updated Vital Signs BP (!) 146/73   Pulse 78   Temp 98.5 F (36.9 C) (Oral)   Resp 20   Ht 1.549 m (5' 1)   Wt 87.5 kg   SpO2 95%   BMI 36.47 kg/m   Physical Exam Vitals and  nursing note reviewed.  Constitutional:      General: She is not in acute distress.    Appearance: She is well-developed.  HENT:     Head: Normocephalic and atraumatic.  Eyes:     Conjunctiva/sclera: Conjunctivae normal.  Cardiovascular:     Rate and Rhythm: Normal rate and regular rhythm.  Pulmonary:     Effort: Pulmonary effort is normal. No respiratory distress.     Breath sounds: No stridor.  Abdominal:     General: There is no distension.     Tenderness: There is no abdominal tenderness. There is no guarding or rebound.  Skin:    General: Skin is warm and dry.  Neurological:     Mental Status: She is alert and oriented  to person, place, and time.     Cranial Nerves: No cranial nerve deficit.  Psychiatric:        Mood and Affect: Mood normal.     (all labs ordered are listed, but only abnormal results are displayed) Labs Reviewed  COMPREHENSIVE METABOLIC PANEL WITH GFR - Abnormal; Notable for the following components:      Result Value   Potassium 3.3 (*)    All other components within normal limits  LIPASE, BLOOD - Abnormal; Notable for the following components:   Lipase 52 (*)    All other components within normal limits  URINALYSIS, ROUTINE W REFLEX MICROSCOPIC - Abnormal; Notable for the following components:   Color, Urine AMBER (*)    Specific Gravity, Urine 1.004 (*)    Hgb urine dipstick MODERATE (*)    Protein, ur 100 (*)    Leukocytes,Ua SMALL (*)    Bacteria, UA RARE (*)    All other components within normal limits  CBC WITH DIFFERENTIAL/PLATELET    EKG: None  Radiology: CT RENAL STONE STUDY Result Date: 11/14/2024 EXAM: CT ABDOMEN AND PELVIS WITHOUT CONTRAST 11/14/2024 05:40:59 PM TECHNIQUE: CT of the abdomen and pelvis was performed without the administration of intravenous contrast. Multiplanar reformatted images are provided for review. Automated exposure control, iterative reconstruction, and/or weight-based adjustment of the mA/kV was utilized to reduce the radiation dose to as low as reasonably achievable. COMPARISON: 09/11/2024 CLINICAL HISTORY: Abdominal/flank pain, stone suspected. FINDINGS: LOWER CHEST: Posterior bibasilar dependent atelectasis. LIVER: The liver is unremarkable. GALLBLADDER AND BILE DUCTS: Gallbladder is unremarkable. No biliary ductal dilatation. SPLEEN: No acute abnormality. PANCREAS: No acute abnormality. ADRENAL GLANDS: No acute abnormality. KIDNEYS, URETERS AND BLADDER: Small nonobstructive left interpolar region calculus. At the left UPJ, there is a 4 mm calculus with mild peripelvic inflammatory stranding. No hydronephrosis. The urinary bladder is  distended and without focal abnormality. GI AND BOWEL: Stomach demonstrates no acute abnormality. There is no bowel obstruction. Status post appendectomy. PERITONEUM AND RETROPERITONEUM: No ascites. No free air. No free pelvic fluid. VASCULATURE: Aorta is normal in caliber. Diffuse aortoiliac atherosclerosis. LYMPH NODES: No lymphadenopathy. REPRODUCTIVE ORGANS: Normal appearance of the uterus and ovaries for patient's age. BONES AND SOFT TISSUES: Lower lumbar facet arthropathy. Degenerative disc disease at L5-S1. Multilevel thoracolumbar osteophytosis. No focal soft tissue abnormality. IMPRESSION: 1. 4 mm calculus at the left UPJ with mild peripelvic inflammatory stranding. No hydronephrosis. 2. Small nonobstructive left interpolar region calculus. Electronically signed by: Rogelia Myers MD 11/14/2024 06:03 PM EST RP Workstation: [id number]     Procedures   Medications Ordered in the ED - No data to display  Medical Decision Making Well-appearing adult female with history of both kidney stones and A-fib, anticoagulated presents with hematuria, dysuria, abdominal pain.  Abdominal discomfort is resolved but given her history, kidney stone, kidney infection, cystitis all considered. CT labs urinalysis ordered.  Amount and/or Complexity of Data Reviewed External Data Reviewed: notes.    Details: Urgent care sent the patient here for evaluation. Labs: ordered. Decision-making details documented in ED Course. Radiology: ordered and independent interpretation performed. Decision-making details documented in ED Course.  Risk Prescription drug management. Decision regarding hospitalization. Diagnosis or treatment significantly limited by social determinants of health.   7:43 PM Patient awake alert ambulatory.  CT reviewed, findings reviewed, no substantial evidence for infection, she is afebrile, awake and alert.  She does have evidence for kidney stone nitrite  negative urine, we discussed follow-up, discharge, patient will continue NSAIDs, fluids, with no evidence of bacteremia, sepsis, obstruction, patient discharged in stable condition.      Final diagnoses:  Nephrolithiasis    ED Discharge Orders     None          Garrick Charleston, MD 11/14/24 1943  "

## 2024-11-14 NOTE — ED Notes (Signed)
 Patient transported to CT

## 2024-11-14 NOTE — ED Triage Notes (Signed)
 Pt with hematuria since yesterday.  Denies any pain at present. Had abdomen pain earlier this morning.  Pt has seen UC this afternoon and sent here.

## 2024-11-14 NOTE — ED Notes (Signed)
 Pt returned from CT

## 2024-11-14 NOTE — Discharge Instructions (Signed)
 Use ibuprofen, 400 mg, 3 times daily with food for pain control.  Follow-up with your physician or return here for concerning changes in your condition.

## 2024-11-15 ENCOUNTER — Telehealth: Payer: Self-pay | Admitting: Urology

## 2024-11-15 DIAGNOSIS — N200 Calculus of kidney: Secondary | ICD-10-CM

## 2024-11-15 NOTE — Telephone Encounter (Signed)
 Patient needs appt for kidney stone. Seen in ER 11/13/2024

## 2024-11-16 NOTE — Telephone Encounter (Signed)
 Called patient to let her know that she is scheduled for an appointment 1/30 @ 9:10am and per verbal from MD McKenzie she needs a xray prior patient voiced her understanding stating she will go get her xray the Thursday before her appointment

## 2024-11-19 ENCOUNTER — Ambulatory Visit: Payer: Medicare Other | Admitting: Internal Medicine

## 2024-11-20 NOTE — Progress Notes (Unsigned)
 "  Name: Sabrina Mcdonald  MRN/ DOB: [id number], Jul 19, 1956    Age/ Sex: 69 y.o., female     PCP: Francis Alan CROME, FNP   Reason for Endocrinology Evaluation: MNG     Initial Endocrinology Clinic Visit: 04/22/2020    PATIENT IDENTIFIER: [name] is a 69 y.o., female with a past medical history of bronchiectasis, Asthma , A. Fib   and depression . She has followed with [facility] since 04/22/2020 for consultative assistance with management of her MNG.   HISTORICAL SUMMARY: Pt presented to her PCP with c/o fatigue, brain fogginess, and  feeling achy , was found to have a low TSH at 0.259 uIU/Ml in 03/2020 . This prompted an ultrasound showing MNG with the largest being a left mid central 1.1x1x1.1 cm nodule.   Uptake and scan showed MNG with a 24-hr uptake of I-131 uptake = 16.9% (normal 10-30%)  Since the uptake was not elevated we opted to treat with methimazole  due to multiple nonspecific symptoms .Which was started in 04/2020   S/P benign FNA of the left thyroid  nodule 05/2020 She is on prednisone  for lung disease since 06/2018   Maternal family history with goiter  SUBJECTIVE:    Today (11/20/2024):  Sabrina Mcdonald is here for a follow up on MNG and hyperthyroidism.  Patient presented to urgent care with hematuria on 11/14/2024, was noted with nephrolithiasis The patient follows with GI for hepatic granuloma Weight is trending down  Has occasional palpitations  Has chronic hand tremors - essential tremors  Denies constipation nor diarrhea  Continues on chronic prednisone  due to chronic lung issues  She has noted with decrease appetite , has no energy , denies depression    Methimazole  5 mg half a tablet daily      HISTORY:  Past Medical History:  Past Medical History:  Diagnosis Date   Abnormal EKG    Anxiety    Broncholithiasis    Fatigue    Heat intolerance    Hypertension    Jaw pain    Kidney stone    Migraine    Pre-diabetes    Renal  disorder    Past Surgical History:  Past Surgical History:  Procedure Laterality Date   APPENDECTOMY     BRAIN SURGERY     CESAREAN SECTION     COLONOSCOPY WITH PROPOFOL  N/A 04/14/2020   Procedure: COLONOSCOPY WITH PROPOFOL ;  Surgeon: Shaaron Lamar HERO, MD;  Location: [facility];  Service: Endoscopy;  Laterality: N/A;  7:30am   kidnsey stones      laproscopy     Social History:  reports that she has never smoked. She has never used smokeless tobacco. She reports that she does not drink alcohol and does not use drugs. Family History:  Family History  Problem Relation Age of Onset   Dementia Mother    Dementia Father    Colon cancer Neg Hx      HOME MEDICATIONS: Allergies as of 11/21/2024       Reactions   Phenobarbital Itching, Rash        Medication List        Accurate as of November 20, 2024  3:17 PM. If you have any questions, ask your nurse or doctor.          acetaminophen  325 MG tablet Commonly known as: TYLENOL  Take 325 mg by mouth daily.   albuterol 108 (90 Base) MCG/ACT inhaler Commonly known as: VENTOLIN HFA Inhale 1-2 puffs  into the lungs every 6 (six) hours as needed for wheezing or shortness of breath.   ALPRAZolam  0.5 MG tablet Commonly known as: XANAX  Take 0.5 mg by mouth 3 (three) times daily as needed.   Brovana 15 MCG/2ML Nebu Generic drug: arformoterol Take 15 mcg by nebulization daily.   clarithromycin 500 MG tablet Commonly known as: BIAXIN Take 500 mg by mouth See admin instructions. Take 1 tablet (500 mg) by mouth twice daily on Mondays, Wednesdays, & Fridays.   Dupixent 300 MG/2ML prefilled syringe Generic drug: dupilumab Inject into the skin every 14 (fourteen) days.   fluticasone 50 MCG/ACT nasal spray Commonly known as: FLONASE Place 1 spray into both nostrils daily.   ibuprofen 200 MG tablet Commonly known as: ADVIL Take 200 mg by mouth daily.   loratadine 10 MG tablet Commonly known as: CLARITIN Take 20 mg by mouth  at bedtime.   losartan -hydrochlorothiazide 100-25 MG tablet Commonly known as: HYZAAR Take 1 tablet by mouth daily.   lurasidone 20 MG Tabs tablet Commonly known as: LATUDA Take 20 mg by mouth daily.   metFORMIN 500 MG 24 hr tablet Commonly known as: GLUCOPHAGE-XR Take 500 mg by mouth daily.   methimazole  5 MG tablet Commonly known as: TAPAZOLE  TAKE 1/2 TABLET BY MOUTH DAILY   metoprolol succinate 25 MG 24 hr tablet Commonly known as: TOPROL-XL Take 25 mg by mouth daily.   montelukast 10 MG tablet Commonly known as: SINGULAIR Take 10 mg by mouth at bedtime.   predniSONE  5 MG tablet Commonly known as: DELTASONE  Take 5 mg by mouth daily.   rosuvastatin 20 MG tablet Commonly known as: CRESTOR Take 20 mg by mouth daily.   Vitamin D (Ergocalciferol) 1.25 MG (50000 UNIT) Caps capsule Commonly known as: DRISDOL Take 50,000 Units by mouth every Sunday.   Xarelto 20 MG Tabs tablet Generic drug: rivaroxaban Take 1 tablet by mouth daily at 6 (six) AM.          OBJECTIVE:   PHYSICAL EXAM: VS: There were no vitals taken for this visit.   EXAM: General: Pt appears well and is in NAD  Neck: General: Supple without adenopathy. Thyroid : Thyroid  size normal.  No goiter or nodules appreciated.  Lungs: Clear with good BS bilat   Heart: Auscultation: RRR.  Extremities:  Trace  edema   Mental Status:  Orientation: Oriented to time, place, and person Mood and affect: No depression, anxiety, or agitation     DATA REVIEWED:  Latest Reference Range & Units 11/25/23 08:56  TSH 0.40 - 4.50 mIU/L 0.31 (L)  Triiodothyronine,Free,Serum 2.3 - 4.2 pg/mL 3.7  T4,Free(Direct) 0.8 - 1.8 ng/dL 1.1  (L): Data is abnormally low   Results for Mcdonald, Sabrina E (MRN [id number]) as of 05/30/2020 12:56  Ref. Range 04/22/2020 14:33  TRAB Latest Ref Range: <=2.00 IU/L <1.00   Thyroid  Ultrasound 12/20/2022 Texture: Echotexture is heterogeneous.  Flow in the thyroid  is normal Total  number of nodules >1 cm =2   Location: Isthmus , maximum size 2.9 cm x 1.5x3.0 cm. composition: Solid/almost completely solid, isoechoic, not taller than wide, margins smooth, no echogenic foci   Nodule #2 2.1 cm x 1.2 x 2 cm Isoechoic, not taller than wide, smooth   Impression 1.  Stable multinodular goiter     Thyroid  uptake and scan 05/15/2020  Foci of increased and decreased uptake in both thyroid  lobes consistent with multinodular thyroid  gland.   No dominant mass.   4 hour I-131 uptake = 6.6% (normal  5-20%)   24 hour I-131 uptake = 16.9% (normal 10-30%)   IMPRESSION: Multinodular thyroid  gland.   Normal 4 hour and 24 hour radio iodine uptakes.   ASSESSMENT / PLAN / RECOMMENDATIONS:   Subclinical Hyperthyroidism    - Pt with clinically euthyroid  - This is caused by autonomous MNG - TFT's today show low TSH, will increase methimazole  as below    Medication   Change Methimazole  5 mg, 1 tablet on Sundays, half a tablet rest of the week      2. Multinodular Goiter:   - No local neck symptoms  - She is S/P FNA of the left thyroid  nodule with benign cytology 05/2020 -Thyroid  ultrasound 05/2021 shows stability of the nodules - Thyroid  ultrasound  11/2022 showed stability  -We opted to repeat thyroid  ultrasound next year   F/U in 6 months    Signed electronically by: Stefano Redgie Butts, MD  [facility]  [facility] [address] [place], [place] [address] Phone: [phone] FAX: [phone]      CC: Francis Alan CROME, FNP [address] [place] [address] Phone: [phone]  Fax: [phone]   Return to Endocrinology clinic as below: Future Appointments  Date Time Provider Department Center  11/21/2024 12:10 PM Chase Arnall, Donell Redgie, MD [facility] None  11/23/2024  9:10 AM McKenzie, Belvie CROME, MD [facility] None  03/13/2025  9:50 AM Skeet Juliene SAUNDERS, DO [facility] None       "

## 2024-11-21 ENCOUNTER — Ambulatory Visit: Admitting: Internal Medicine

## 2024-11-22 ENCOUNTER — Telehealth: Payer: Self-pay | Admitting: Neurology

## 2024-11-22 ENCOUNTER — Ambulatory Visit (HOSPITAL_COMMUNITY)
Admission: RE | Admit: 2024-11-22 | Discharge: 2024-11-22 | Disposition: A | Discharge status: Home / Self Care | Source: Ambulatory Visit | Attending: Urology | Admitting: Urology

## 2024-11-22 DIAGNOSIS — N200 Calculus of kidney: Secondary | ICD-10-CM | POA: Diagnosis present

## 2024-11-22 NOTE — Telephone Encounter (Signed)
 [employer] call ID: [id number]  Sabrina Mcdonald stated she was returning a all from the office.

## 2024-11-22 NOTE — Telephone Encounter (Signed)
 Per patient she was advised by ENT she has something her brain and she feels like Dr.Jaffe is not helping her. She feels like she needs a neuro surgeon referral to see what is going on. What is on her Brain. I need a second opinion.   Advised patient Dr.Jaffe reviewed her MRI and she was advised of his notes and what he recommends. See encounter 11/06/24 and she was offered the next available appt for Dr.Jaffe. if someone cancels we can move her appt sooner.  Per patient she was advised by Previous Neurosurgeons that she shouldn't take any antiseizure medication.

## 2024-11-22 NOTE — Telephone Encounter (Signed)
 Pt called in this morning and Pt wants a referral to go to a Neurosurgeon. Pt would like to  request a referred to  go to Dr. Victory Gens In [place]. Thanks

## 2024-11-22 NOTE — Telephone Encounter (Signed)
 LMOVM for patient, What is the reasoning for the want for referral.

## 2024-11-23 ENCOUNTER — Encounter: Payer: Self-pay | Admitting: Urology

## 2024-11-23 ENCOUNTER — Ambulatory Visit: Admitting: Urology

## 2024-11-23 VITALS — BP 106/67 | HR 72

## 2024-11-23 DIAGNOSIS — N200 Calculus of kidney: Secondary | ICD-10-CM

## 2024-11-23 LAB — URINALYSIS, ROUTINE W REFLEX MICROSCOPIC
Bilirubin, UA: NEGATIVE
Glucose, UA: NEGATIVE
Ketones, UA: NEGATIVE
Nitrite, UA: NEGATIVE
Protein,UA: NEGATIVE
Specific Gravity, UA: <1.005 — ABNORMAL LOW (ref 1.005–1.030)
Urobilinogen, Ur: 0.2 mg/dL (ref 0.2–1.0)
pH, UA: 6.5 (ref 5.0–7.5)

## 2024-11-23 LAB — MICROSCOPIC EXAMINATION: Bacteria, UA: NONE SEEN

## 2024-11-23 MED ORDER — TAMSULOSIN HCL 0.4 MG PO CAPS: 0.4000 mg | ORAL_CAPSULE | Freq: Every day | ORAL | 0 refills | Status: AC

## 2024-11-23 NOTE — Patient Instructions (Signed)
 Preventing Kidney Stones: Eating Plan Kidney stones are deposits of minerals and salts that form inside your kidneys. Your risk of developing kidney stones may be greater depending on your diet, your lifestyle, the medicines you take, and whether you have certain medical conditions. Most people can lower their risks of developing kidney stones by following these dietary guidelines. Your dietitian may give you more specific instructions depending on your overall health and the type of kidney stones you tend to develop. What are tips for following this plan? Reading food labels  Choose foods with no salt added or low-salt labels. Limit your salt (sodium) intake to less than 1,500 mg a day. Choose foods with calcium for each meal and snack. Try to eat about 300 mg of calcium at each meal. Foods that contain 200-500 mg of calcium a serving include: 8 oz (237 mL) of milk, calcium-fortifiednon-dairy milk, and calcium-fortifiedfruit juice. Calcium-fortified means that calcium has been added to these drinks. 8 oz (237 mL) of kefir, yogurt, and soy yogurt. 4 oz (114 g) of tofu. 1 oz (28 g) of cheese. 1 cup (150 g) of dried figs. 1 cup (91 g) of cooked broccoli. One 3 oz (85 g) can of sardines or mackerel. Most people need 1,000-1,500 mg of calcium a day. Talk to your dietitian about how much calcium is recommended for you. Shopping Buy plenty of fresh fruits and vegetables. Most people do not need to avoid fruits and vegetables, even if these foods contain nutrients that may contribute to kidney stones. When shopping for convenience foods, choose: Whole pieces of fruit. Pre-made salads with dressing on the side. Low-fat fruit and yogurt smoothies. Avoid buying frozen meals or prepared deli foods. These can be high in sodium. Look for foods with live cultures, such as yogurt and kefir. Choose high-fiber grains, such as whole-wheat breads, oat bran, and wheat cereals. Cooking Do not add salt to  food when cooking. Place a salt shaker on the table and allow each person to add their own salt to taste. Use vegetable protein, such as beans, textured vegetable protein (TVP), or tofu, instead of meat in pasta, casseroles, and soups. Meal planning Eat less salt, if told by your dietitian. To do this: Avoid eating processed or pre-made food. Avoid eating fast food. Eat less animal protein, including cheese, meat, poultry, or fish, if told by your dietitian. To do this: Limit the number of times you have meat, poultry, fish, or cheese each week. Eat a diet free of meat at least 2 days a week. Eat only one serving each day of meat, poultry, fish, or seafood. When you prepare animal proteins, cut pieces into small portion sizes. For most meat and fish, one serving is about the size of the palm of your hand. Eat at least five servings of fresh fruits and vegetables each day. To do this: Keep fruits and vegetables on hand for snacks. Eat one piece of fruit or a handful of berries with breakfast. Have a salad and fruit at lunch. Have two kinds of vegetables at dinner. You may be told to limit foods that are high in a substance called oxalate. These include: Spinach (cooked), rhubarb, beets, sweet potatoes, and Swiss chard. Peanuts. Potato chips, french fries, and baked potatoes with skin on. Nuts and nut products. Chocolate. If you regularly take a diuretic medicine, make sure to eat at least 1 or 2 servings of fruits or vegetables that are high in potassium each day. These include: Avocado. Banana. Orange,  prune, carrot, or tomato juice. Baked potato. Cabbage. Beans and split peas. Lifestyle  Drink enough fluid to keep your urine pale yellow. This is the most important thing you can do. Spread your fluid intake throughout the day. If you drink alcohol: Limit how much you have to: 0-1 drink a day for women who are not pregnant. 0-2 drinks a day for men. Know how much alcohol is in your  drink. In the [place] one drink equals one 12 oz bottle of beer (355 mL), one 5 oz glass of wine (148 mL), or one 1 oz glass of hard liquor (44 mL). Lose weight if told by your health care provider. Work with your dietitian to find an eating plan and weight loss strategies that work best for you. General information Talk to your health care provider and dietitian about taking daily supplements. Depending on your health and the cause of your kidney stones, you may be told: Do not take high-dose supplements of vitamin C (1,000 mg a day or more). To take a calcium supplement. To take a daily probiotic supplement. To take other supplements such as magnesium, fish oil, or vitamin B6. Take over-the-counter and prescription medicines only as told by your health care provider. These include supplements. What foods should I limit? Limit your intake of the following foods, or eat them as told by your dietitian. Vegetables Spinach. Rhubarb. Beets. Canned vegetables. Dene. Olives. Baked potatoes with skin. Grains Wheat bran. Baked goods. Salted crackers. Cereals high in sugar. Meats and other proteins Nuts. Nut butters. Large portions of meat, poultry, or fish. Salted, precooked, or cured meats, such as sausages, meat loaves, and hot dogs. Dairy Cheeses. Beverages Regular soft drinks. Regular vegetable juice. Seasonings and condiments Seasoning blends with salt. Salad dressings. Soy sauce. Ketchup. Barbecue sauce. Other foods Canned soups. Canned pasta sauce. Casseroles. Pizza. Lasagna. Frozen meals. Potato chips. French fries. The items listed above may not be a complete list of foods and beverages you should limit. Contact a dietitian for more information. What foods should I avoid? Talk to your dietitian about specific foods you should avoid based on the type of kidney stones you have and your overall health. Fruits Grapefruit. The item listed above may not be a complete list of foods and  beverages you should avoid. Contact a dietitian for more information. Summary Kidney stones are deposits of minerals and salts that form inside your kidneys. You can lower your risk of kidney stones by making changes to your diet. The most important thing you can do is drink enough fluid. Drink enough fluid to keep your urine pale yellow. Talk to your dietitian about how much calcium you should have each day, and eat less salt and animal protein as told by your dietitian. This information is not intended to replace advice given to you by your health care provider. Make sure you discuss any questions you have with your health care provider. Document Revised: 08/19/2024 Document Reviewed: 01/21/2022 Elsevier Patient Education  2025 [employer].

## 2024-11-23 NOTE — Addendum Note (Signed)
 Addended byBETHA MALACHY SLICE on: 11/23/2024 09:51 AM   Modules accepted: Orders

## 2024-11-27 ENCOUNTER — Telehealth: Payer: Self-pay

## 2024-11-27 NOTE — Telephone Encounter (Signed)
 Patient LVM that she has 3 stone and she is in a lot of pain. Patient want to know what can be done about kidney stone. Pt is aware a message will be sent to MD. Voiced understanding

## 2024-12-07 ENCOUNTER — Ambulatory Visit: Admitting: Urology

## 2025-02-08 ENCOUNTER — Ambulatory Visit: Admitting: Internal Medicine

## 2025-03-13 ENCOUNTER — Ambulatory Visit: Payer: Self-pay | Admitting: Neurology

## 2025-06-19 ENCOUNTER — Ambulatory Visit: Payer: Medicare Other | Admitting: Internal Medicine
